# Patient Record
Sex: Male | Born: 1980 | Race: White | Hispanic: No | Marital: Single | Smoking: Former smoker
Health system: Southern US, Community
[De-identification: ages and names within clinical notes are randomized; demographics above are authoritative.]

## PROBLEM LIST (undated history)

## (undated) DIAGNOSIS — F429 Obsessive-compulsive disorder, unspecified: Secondary | ICD-10-CM

## (undated) DIAGNOSIS — Z9109 Other allergy status, other than to drugs and biological substances: Secondary | ICD-10-CM

## (undated) DIAGNOSIS — R7989 Other specified abnormal findings of blood chemistry: Secondary | ICD-10-CM

## (undated) HISTORY — DX: Other specified abnormal findings of blood chemistry: R79.89

## (undated) HISTORY — DX: Obsessive-compulsive disorder, unspecified: F42.9

## (undated) HISTORY — PX: NO PAST SURGERIES: SHX2092

---

## 2003-05-29 ENCOUNTER — Emergency Department (HOSPITAL_COMMUNITY): Admission: EM | Admit: 2003-05-29 | Discharge: 2003-05-30 | Payer: Self-pay | Admitting: *Deleted
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
## 2008-04-29 DIAGNOSIS — R7989 Other specified abnormal findings of blood chemistry: Secondary | ICD-10-CM

## 2008-04-29 HISTORY — DX: Other specified abnormal findings of blood chemistry: R79.89

## 2010-02-15 ENCOUNTER — Encounter: Admission: RE | Admit: 2010-02-15 | Discharge: 2010-03-20 | Payer: Self-pay | Admitting: Family Medicine

## 2010-11-12 ENCOUNTER — Ambulatory Visit (INDEPENDENT_AMBULATORY_CARE_PROVIDER_SITE_OTHER): Payer: 59 | Admitting: Professional

## 2010-11-12 DIAGNOSIS — F411 Generalized anxiety disorder: Secondary | ICD-10-CM

## 2010-11-22 ENCOUNTER — Ambulatory Visit (INDEPENDENT_AMBULATORY_CARE_PROVIDER_SITE_OTHER): Payer: 59 | Admitting: Professional

## 2010-11-22 DIAGNOSIS — F411 Generalized anxiety disorder: Secondary | ICD-10-CM

## 2011-06-09 ENCOUNTER — Emergency Department (HOSPITAL_COMMUNITY)
Admission: EM | Admit: 2011-06-09 | Discharge: 2011-06-09 | Disposition: A | Discharge status: Home / Self Care | Payer: BC Managed Care – PPO | Source: Home / Self Care | Attending: Family Medicine | Admitting: Family Medicine

## 2011-06-09 ENCOUNTER — Encounter (HOSPITAL_COMMUNITY): Payer: Self-pay

## 2011-06-09 DIAGNOSIS — J029 Acute pharyngitis, unspecified: Secondary | ICD-10-CM

## 2011-06-09 HISTORY — DX: Other allergy status, other than to drugs and biological substances: Z91.09

## 2011-06-09 MED ORDER — CLINDAMYCIN HCL 150 MG PO CAPS: 150.0000 mg | ORAL_CAPSULE | Freq: Four times a day (QID) | ORAL | Status: AC

## 2011-06-09 NOTE — ED Notes (Signed)
Sore throat started Monday, denies fever/n/v

## 2011-06-09 NOTE — ED Provider Notes (Signed)
History     CSN: [id number]  Arrival date & time 06/09/11  1448   None     Chief Complaint  Patient presents with  . Sore Throat    sore throat started on monday    (Consider location/radiation/quality/duration/timing/severity/associated sxs/prior treatment) Patient is a 31 y.o. male presenting with pharyngitis. The history is provided by the patient.  Sore Throat This is a new problem. The current episode started more than 2 days ago. The problem occurs constantly. The problem has been gradually worsening. Pertinent negatives include no chest pain and no abdominal pain. The symptoms are aggravated by swallowing.    Past Medical History  Diagnosis Date  . Environmental allergies     History reviewed. No pertinent past surgical history.  History reviewed. No pertinent family history.  History  Substance Use Topics  . Smoking status: Never Smoker   . Smokeless tobacco: Not on file  . Alcohol Use: Yes      Review of Systems  Constitutional: Negative.   HENT: Negative for congestion, rhinorrhea and postnasal drip.   Eyes: Negative.   Respiratory: Negative.   Cardiovascular: Negative for chest pain.  Gastrointestinal: Negative.  Negative for abdominal pain.  Genitourinary: Negative.     Allergies  Penicillins  Home Medications   Current Outpatient Rx  Name Route Sig Dispense Refill  . CETIRIZINE-PSEUDOEPHEDRINE ER 5-120 MG PO TB12 Oral Take 1 tablet by mouth 2 (two) times daily.    . TESTOSTERONE AQUEOUS IM Intramuscular Inject into the muscle.    Marland Kitchen CLINDAMYCIN HCL 150 MG PO CAPS Oral Take 1 capsule (150 mg total) by mouth every 6 (six) hours. 28 capsule 0    BP 132/87  Pulse 99  Temp(Src) 98.8 F (37.1 C) (Oral)  Resp 18  SpO2 100%  Physical Exam  Nursing note and vitals reviewed. Constitutional: He is oriented to person, place, and time. He appears well-developed and well-nourished.  HENT:  Head: Normocephalic.  Right Ear: External ear normal.    Left Ear: External ear normal.  Mouth/Throat: Oropharyngeal exudate and posterior oropharyngeal erythema present.  Eyes: Pupils are equal, round, and reactive to light.  Neck: Normal range of motion. Neck supple.  Lymphadenopathy:    He has cervical adenopathy.  Neurological: He is alert and oriented to person, place, and time.  Skin: Skin is warm and dry.  Psychiatric: He has a normal mood and affect.    ED Course  Procedures (including critical care time)   Labs Reviewed  POCT RAPID STREP A ([facility] CARE ONLY)   No results found.   1. Pharyngitis, acute       MDM  Strep neg        Barkley Bruns, MD 06/09/11 1606

## 2012-09-07 ENCOUNTER — Telehealth: Payer: Self-pay | Admitting: Family Medicine

## 2012-09-07 ENCOUNTER — Other Ambulatory Visit: Payer: Self-pay

## 2012-09-07 DIAGNOSIS — Z79899 Other long term (current) drug therapy: Secondary | ICD-10-CM

## 2012-09-07 DIAGNOSIS — R5383 Other fatigue: Secondary | ICD-10-CM

## 2012-09-07 DIAGNOSIS — Z125 Encounter for screening for malignant neoplasm of prostate: Secondary | ICD-10-CM

## 2012-09-07 DIAGNOSIS — E291 Testicular hypofunction: Secondary | ICD-10-CM

## 2012-09-07 DIAGNOSIS — E785 Hyperlipidemia, unspecified: Secondary | ICD-10-CM

## 2012-09-07 NOTE — Telephone Encounter (Signed)
Pt would like to make sure that a PSA, Blood serum Testosterone levels please as well as routine Labs

## 2012-09-07 NOTE — Telephone Encounter (Signed)
Blood work ordered in system. Papers upfront ready for pickup. Patient was notified.

## 2012-09-07 NOTE — Telephone Encounter (Signed)
Pt needs BW for PE appt on 5/21

## 2012-09-07 NOTE — Telephone Encounter (Signed)
CBC, liver, lipid profile, metabolic 7, testosterone, PSA. Reason: High risk medicine use for the PSA, CBC, liver.  Metabolic 7 reason-screening. Lipid profile reason hyperlipidemia.

## 2012-09-09 LAB — BASIC METABOLIC PANEL
BUN: 19 mg/dL (ref 6–23)
Calcium: 9.6 mg/dL (ref 8.4–10.5)
Creat: 1.12 mg/dL (ref 0.50–1.35)
Glucose, Bld: 81 mg/dL (ref 70–99)
Sodium: 140 mEq/L (ref 135–145)

## 2012-09-09 LAB — CBC WITH DIFFERENTIAL/PLATELET
Basophils Absolute: 0 10*3/uL (ref 0.0–0.1)
Eosinophils Absolute: 0.1 10*3/uL (ref 0.0–0.7)
Lymphs Abs: 0.9 10*3/uL (ref 0.7–4.0)
MCH: 29.2 pg (ref 26.0–34.0)
Neutrophils Relative %: 56 % (ref 43–77)
Platelets: 187 10*3/uL (ref 150–400)
RBC: 4.73 MIL/uL (ref 4.22–5.81)
WBC: 3.1 10*3/uL — ABNORMAL LOW (ref 4.0–10.5)

## 2012-09-09 LAB — LIPID PANEL
Cholesterol: 147 mg/dL (ref 0–200)
HDL: 37 mg/dL — ABNORMAL LOW (ref >39)
Total CHOL/HDL Ratio: 4 Ratio
Triglycerides: 78 mg/dL (ref <150)

## 2012-09-09 LAB — HEPATIC FUNCTION PANEL
Albumin: 4.7 g/dL (ref 3.5–5.2)
Alkaline Phosphatase: 56 U/L (ref 39–117)
Total Bilirubin: 0.7 mg/dL (ref 0.3–1.2)

## 2012-09-09 LAB — TESTOSTERONE: Testosterone: 318 ng/dL (ref 300–890)

## 2012-09-16 ENCOUNTER — Encounter: Payer: Self-pay | Admitting: Family Medicine

## 2012-09-16 ENCOUNTER — Ambulatory Visit (INDEPENDENT_AMBULATORY_CARE_PROVIDER_SITE_OTHER): Payer: BC Managed Care – PPO | Admitting: Family Medicine

## 2012-09-16 VITALS — BP 104/77 | HR 80 | Ht 72.5 in | Wt 192.6 lb

## 2012-09-16 DIAGNOSIS — R5383 Other fatigue: Secondary | ICD-10-CM

## 2012-09-16 DIAGNOSIS — Z7251 High risk heterosexual behavior: Secondary | ICD-10-CM

## 2012-09-16 DIAGNOSIS — Z0000 Encounter for general adult medical examination without abnormal findings: Secondary | ICD-10-CM

## 2012-09-16 NOTE — Progress Notes (Signed)
  Subjective:    Patient ID: Christopher Chase, male    DOB: [birth year], 32 y.o.   MRN: [id number]  HPI Dietary overall fairly good safety measures good patient denies any specific illnesses other than some lower back discomfort and occasional dizziness please see below he is in today for a wellness exam. Reviewed over her medications and reviewed over her lab work.  Patient relates sometimes when he is exercising he does feel dark minutes in the peripheral vision of the neck gets better he also states at times he pushes his heart rate up to 180 or 190 Review of Systems  Constitutional: Negative for fever, activity change and appetite change.  HENT: Negative for congestion, rhinorrhea and neck pain.   Eyes: Negative for discharge.  Respiratory: Negative for cough and wheezing.   Cardiovascular: Negative for chest pain.  Gastrointestinal: Negative for vomiting, abdominal pain and blood in stool.  Genitourinary: Negative for frequency and difficulty urinating.  Skin: Negative for rash.  Allergic/Immunologic: Negative for environmental allergies and food allergies.  Neurological: Negative for weakness and headaches.  Psychiatric/Behavioral: Negative for agitation.       Objective:   Physical Exam  Vitals reviewed. Constitutional: He appears well-developed and well-nourished.  HENT:  Head: Normocephalic and atraumatic.  Right Ear: External ear normal.  Left Ear: External ear normal.  Nose: Nose normal.  Mouth/Throat: Oropharynx is clear and moist.  Eyes: EOM are normal. Pupils are equal, round, and reactive to light.  Neck: Normal range of motion. Neck supple. No thyromegaly present.  Cardiovascular: Normal rate, regular rhythm and normal heart sounds.   No murmur heard. Pulmonary/Chest: Effort normal and breath sounds normal. No respiratory distress. He has no wheezes.  Abdominal: Soft. Bowel sounds are normal. He exhibits no distension and no mass. There is no tenderness.   Genitourinary: Penis normal.  Musculoskeletal: Normal range of motion. He exhibits no edema.  Lymphadenopathy:    He has no cervical adenopathy.  Neurological: He is alert. He exhibits normal muscle tone.  Skin: Skin is warm and dry. No erythema.  Psychiatric: He has a normal mood and affect. His behavior is normal. Judgment normal.          Assessment & Plan:  Wellness-In the usual measures were discussed with them regarding diet exercise. He does exercise on regular basis at times he pushes himself we'll order the one I think he should we talked about maximum heart rate and cardio exercise. Labs reviewed- labs were reviewed with him. No specific changes necessary today Back pain-Patient with some lower back pain on the right side he states that one leg is shorter than the other on examination he does have some slight scoliosis subjective discomfort in the right lower back the legs do not have a big discrepancy. I talked with him about referral to orthopedics how they may be able to help they may not be able to for now he will put this off. Dizziness-He relates dizziness related to when he gets up suddenly from lying down or sitting I checked his blood pressure laying and sitting and in there is minimal drop but there is some drop a talk with him about proper hydration and getting up slowly

## 2013-01-01 LAB — CBC WITH DIFFERENTIAL/PLATELET
Basophils Relative: 1 % (ref 0–1)
Eosinophils Absolute: 0.1 10*3/uL (ref 0.0–0.7)
Eosinophils Relative: 2 % (ref 0–5)
MCH: 29.4 pg (ref 26.0–34.0)
MCHC: 34.1 g/dL (ref 30.0–36.0)
Neutrophils Relative %: 61 % (ref 43–77)
Platelets: 194 10*3/uL (ref 150–400)
RDW: 12.7 % (ref 11.5–15.5)

## 2013-01-01 LAB — HIV ANTIBODY (ROUTINE TESTING W REFLEX): HIV: NONREACTIVE

## 2013-01-02 ENCOUNTER — Encounter: Payer: Self-pay | Admitting: Family Medicine

## 2013-01-21 ENCOUNTER — Telehealth: Payer: Self-pay | Admitting: Family Medicine

## 2013-01-21 NOTE — Telephone Encounter (Signed)
Need results of bloodwork.

## 2013-01-21 NOTE — Telephone Encounter (Signed)
Pt was seen for routine physical examination on 09-16-12.  He had to speak with his insurance regarding this date and was told that High Risk Sexual behavior was listed as a diagnosis.  He wants this removed from his chart as he states this does not pertain to him.  Please let him know this was removed out of his chart.

## 2013-01-21 NOTE — Telephone Encounter (Signed)
Results were given. Patient aware of it. For the business staff is working on removing the diagnosis.

## 2013-03-04 ENCOUNTER — Other Ambulatory Visit: Payer: Self-pay

## 2014-02-11 ENCOUNTER — Other Ambulatory Visit: Payer: Self-pay

## 2014-02-21 ENCOUNTER — Telehealth: Payer: Self-pay | Admitting: Family Medicine

## 2014-02-21 DIAGNOSIS — Z139 Encounter for screening, unspecified: Secondary | ICD-10-CM

## 2014-02-21 DIAGNOSIS — Z13220 Encounter for screening for lipoid disorders: Secondary | ICD-10-CM

## 2014-02-21 DIAGNOSIS — Z113 Encounter for screening for infections with a predominantly sexual mode of transmission: Secondary | ICD-10-CM

## 2014-02-21 NOTE — Telephone Encounter (Signed)
Pt would also like herpes I and II added to test. Consult with Dr. Lorin PicketScott ok to add. Pt also states it is ok to leave message on his voicemail with the results. [phone]. Orders ready. Pt notified.

## 2014-02-21 NOTE — Telephone Encounter (Signed)
Patient wanting to check for all STD's.

## 2014-02-21 NOTE — Telephone Encounter (Signed)
Last bloodwork 01/01/13. CBC, HIV

## 2014-02-21 NOTE — Telephone Encounter (Signed)
HIV antibody, VDRL, urine GC/Chlam ( first part of urine) also pt should do Lipid , glucose ( been over 1 year,he might defer) (also we rec a f/u ov)

## 2014-02-21 NOTE — Telephone Encounter (Signed)
Patient needs order for BW. °

## 2014-02-22 LAB — LIPID PANEL
CHOL/HDL RATIO: 2.8 ratio
CHOLESTEROL: 136 mg/dL (ref 0–200)
HDL: 48 mg/dL (ref >39)
LDL Cholesterol: 69 mg/dL (ref 0–99)
TRIGLYCERIDES: 96 mg/dL (ref <150)
VLDL: 19 mg/dL (ref 0–40)

## 2014-02-22 LAB — GLUCOSE, RANDOM: Glucose, Bld: 93 mg/dL (ref 70–99)

## 2014-02-22 LAB — HIV ANTIBODY (ROUTINE TESTING W REFLEX): HIV 1&2 Ab, 4th Generation: NONREACTIVE

## 2014-02-23 LAB — HSV(HERPES SIMPLEX VRS) I + II AB-IGG
HSV 1 Glycoprotein G Ab, IgG: <0.1 IV
HSV 2 Glycoprotein G Ab, IgG: <0.1 IV

## 2014-02-23 LAB — GC/CHLAMYDIA PROBE AMP, URINE
CHLAMYDIA, SWAB/URINE, PCR: NEGATIVE
GC PROBE AMP, URINE: NEGATIVE

## 2014-02-23 LAB — VDRL: VDRL, SERUM: NONREACTIVE

## 2014-03-07 ENCOUNTER — Ambulatory Visit: Payer: BC Managed Care – PPO | Admitting: Family Medicine

## 2014-05-12 ENCOUNTER — Ambulatory Visit (INDEPENDENT_AMBULATORY_CARE_PROVIDER_SITE_OTHER): Payer: BLUE CROSS/BLUE SHIELD | Admitting: Psychiatry

## 2014-05-12 ENCOUNTER — Encounter (HOSPITAL_COMMUNITY): Payer: Self-pay | Admitting: Psychiatry

## 2014-05-12 VITALS — BP 129/87 | HR 74 | Wt 200.0 lb

## 2014-05-12 DIAGNOSIS — F429 Obsessive-compulsive disorder, unspecified: Secondary | ICD-10-CM

## 2014-05-12 DIAGNOSIS — F42 Obsessive-compulsive disorder: Secondary | ICD-10-CM

## 2014-05-12 MED ORDER — ESCITALOPRAM OXALATE 10 MG PO TABS: 10.0000 mg | ORAL_TABLET | Freq: Every day | ORAL | Status: DC

## 2014-05-12 MED ORDER — CLONAZEPAM 0.5 MG PO TABS: ORAL_TABLET | ORAL | Status: DC
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
## 2014-05-17 ENCOUNTER — Ambulatory Visit (INDEPENDENT_AMBULATORY_CARE_PROVIDER_SITE_OTHER): Payer: BLUE CROSS/BLUE SHIELD | Admitting: Nurse Practitioner

## 2014-05-17 ENCOUNTER — Encounter: Payer: Self-pay | Admitting: Nurse Practitioner

## 2014-05-17 VITALS — BP 112/74 | Ht 72.5 in | Wt 204.0 lb

## 2014-05-17 DIAGNOSIS — R7989 Other specified abnormal findings of blood chemistry: Secondary | ICD-10-CM

## 2014-05-17 DIAGNOSIS — E291 Testicular hypofunction: Secondary | ICD-10-CM

## 2014-05-17 DIAGNOSIS — N529 Male erectile dysfunction, unspecified: Secondary | ICD-10-CM

## 2014-05-17 MED ORDER — TADALAFIL 10 MG PO TABS: 10.0000 mg | ORAL_TABLET | Freq: Every day | ORAL | Status: DC | PRN

## 2014-05-18 ENCOUNTER — Encounter: Payer: Self-pay | Admitting: Nurse Practitioner

## 2014-05-18 NOTE — Progress Notes (Signed)
Subjective:  Presents for c/o ED. History of low testosterone; has been off therapy for over one year. Has an appt with Dr. [name], his endocrinologist to restart therapy. Was place on Lexapro and Klonopin by behavioral health for anxiety and OCD. Also has plans to get counseling. Requesting Cialis to help with ED in the meantime. No history of cardiac problems.   Objective:   BP 112/74 mmHg  Ht 6' 0.5" (1.842 m)  Wt 204 lb (92.534 kg)  BMI 27.27 kg/m2 NAD. Alert, oriented. Lungs clear. Heart RRR.  Assessment: Low testosterone  Erectile dysfunction, unspecified erectile dysfunction type  Plan:  Meds ordered this encounter  Medications  . tadalafil (CIALIS) 10 MG tablet    Sig: Take 1 tablet (10 mg total) by mouth daily as needed for erectile dysfunction.    Dispense:  10 tablet    Refill:  2    Order Specific Question:  Supervising Provider    Answer:  Merlyn AlbertLUKING, WILLIAM S [[id number]   Trial of Cialis. Follow up with Dr. [name] as planned. Also discussed potential sexual side effects of Lexapro.

## 2014-05-27 ENCOUNTER — Ambulatory Visit (INDEPENDENT_AMBULATORY_CARE_PROVIDER_SITE_OTHER): Payer: BLUE CROSS/BLUE SHIELD | Admitting: Clinical

## 2014-05-27 ENCOUNTER — Encounter (HOSPITAL_COMMUNITY): Payer: Self-pay | Admitting: Clinical

## 2014-05-27 DIAGNOSIS — F429 Obsessive-compulsive disorder, unspecified: Secondary | ICD-10-CM

## 2014-05-27 DIAGNOSIS — F42 Obsessive-compulsive disorder: Secondary | ICD-10-CM | POA: Diagnosis not present
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
## 2014-06-02 ENCOUNTER — Ambulatory Visit (HOSPITAL_COMMUNITY): Payer: Self-pay | Admitting: Psychiatry

## 2014-06-03 ENCOUNTER — Encounter (HOSPITAL_COMMUNITY): Payer: Self-pay | Admitting: Psychiatry

## 2014-06-03 ENCOUNTER — Ambulatory Visit (INDEPENDENT_AMBULATORY_CARE_PROVIDER_SITE_OTHER): Payer: BLUE CROSS/BLUE SHIELD | Admitting: Psychiatry

## 2014-06-03 VITALS — BP 136/80 | HR 89 | Ht 72.0 in | Wt 202.0 lb

## 2014-06-03 DIAGNOSIS — F42 Obsessive-compulsive disorder: Secondary | ICD-10-CM

## 2014-06-03 DIAGNOSIS — F429 Obsessive-compulsive disorder, unspecified: Secondary | ICD-10-CM

## 2014-06-03 MED ORDER — CLONAZEPAM 0.5 MG PO TABS: ORAL_TABLET | ORAL | Status: DC

## 2014-06-03 MED ORDER — ESCITALOPRAM OXALATE 10 MG PO TABS: 10.0000 mg | ORAL_TABLET | Freq: Every day | ORAL | Status: DC

## 2014-06-03 NOTE — Progress Notes (Signed)
[facility] [id number] Progress Note  Christopher Chase [id number] 34 y.o.  06/03/2014 12:34 PM  Chief Complaint:  I like my medication.  I'm feeling better.    History of Present Illness:  Christopher Chase came for his follow-up appointment.  He was seen first time 3 weeks ago as initial evaluation.  He was started on Lexapro and low-dose Klonopin.  He is taking Lexapro 10 mg daily.  He is feeling less anxious but sometime he is feeling very tired in the morning.  He endorsed since taking medication there was only one episode when he felt very nervous anxious but he did not lost his temper which he believes because of medication.  He continues to have obsessive thoughts but overall he feels the relationship with the girlfriend is doing very well.  He started seeing [name] and he like to continue therapy with her.  He is concerned about sexual side effects from Lexapro and he has noticed an past 2 weeks decreased desire and libido.  However he also supposed to start testosterone which she has not started yet.  Patient has history of low testosterone and recently he was given a prescription by Dr. [name] which he has not started yet.  Patient is taking Klonopin half tablet only as needed.  He has cut down his drinking a lot and he denies any binge drinking.  His appetite is okay.  Patient denies any paranoia, hallucination, crying spells or any feeling of hopelessness or worthlessness.  He wants to continue Lexapro 10 mg for now however if sexual side effect us persist that he will consider switching to different medication.  He also endorsed some time low appetite but his weight and vitals are normal.    Suicidal Ideation: No Plan Formed: No Patient has means to carry out plan: No  Homicidal Ideation: No Plan Formed: No Patient has means to carry out plan: No  Past Psychiatric History/Hospitalization(s) Patient endorse history of anxiety and OCD symptoms while he was in college.  At that time he has  taken antidepressant but does not remember the dose and name.  He mentioned it helped his anxiety symptoms but he stopped after some time and he was feeling better.  He remembered taking Xanax and Zoloft which helped him but he was feeling zombie after taking Zoloft for a long time.  Patient also endorsed history of using drugs and alcohol in the college but denies any withdrawal symptoms.  Patient denies any history of mania, psychosis, hallucination, suicidal attempt or any inpatient psychiatric treatment. Anxiety: Yes Bipolar Disorder: No Depression: No Mania: No Psychosis: No Schizophrenia: No Personality Disorder: No Hospitalization for psychiatric illness: No History of Electroconvulsive Shock Therapy: No Prior Suicide Attempts: No  Medical History; Patient denies any history of seizures.  His primary care physician is Dr. [name] luking in [place].  He has seasonal allergies and low testosterone.  Review of Systems  Constitutional: Positive for malaise/fatigue.  Musculoskeletal: Negative.   Skin: Negative.   Neurological: Negative.   Psychiatric/Behavioral: The patient is nervous/anxious.     Psychiatric: Agitation: No Hallucination: No Depressed Mood: No Insomnia: No Hypersomnia: No Altered Concentration: No Feels Worthless: No Grandiose Ideas: No Belief In Special Powers: No New/Increased Substance Abuse: No Compulsions: Yes  Neurologic: Headache: No Seizure: No Paresthesias: No   Musculoskeletal: Strength & Muscle Tone: within normal limits Gait & Station: normal Patient leans: N/A   Outpatient Encounter Prescriptions as of 06/03/2014  Medication Sig  . cetirizine-pseudoephedrine (ZYRTEC-D) 5-120 MG  per tablet Take 1 tablet by mouth 2 (two) times daily.  . clonazePAM (KLONOPIN) 0.5 MG tablet Take 1/2 tab as needed  . escitalopram (LEXAPRO) 10 MG tablet Take 1 tablet (10 mg total) by mouth daily.  . tadalafil (CIALIS) 10 MG tablet Take 1 tablet (10 mg  total) by mouth daily as needed for erectile dysfunction.  . [DISCONTINUED] clonazePAM (KLONOPIN) 0.5 MG tablet Take 1/2 tab as needed  . [DISCONTINUED] escitalopram (LEXAPRO) 10 MG tablet Take 1 tablet (10 mg total) by mouth daily.  . fluticasone (FLONASE) 50 MCG/ACT nasal spray Place 2 sprays into the nose daily.    No results found for this or any previous visit (from the past 2160 hour(s)).    Constitutional:  BP 136/80 mmHg  Pulse 89  Ht 6' (1.829 m)  Wt 202 lb (91.627 kg)  BMI 27.39 kg/m2   Mental Status Examination;  Patient is well groomed well dressed man who is pleasant and cooperative.  His his speech is clear and coherent.  He described his mood anxious and his affect is mood appropriate.  His attention and concentration is fair.  He denies any auditory or visual hallucination.  He denies any active or passive suicidal thoughts or homicidal thought.  There were no delusions, paranoia or any obsessive thoughts.  His psychomotor activity is slightly increased.  His fund of knowledge is adequate.  He has no flight of ideas or any loose association.  He is alert and oriented 3.  His insight judgment and impulse control is okay.   Established Problem, Stable/Improving (1), Review of Psycho-Social Stressors (1), Decision to obtain old records (1), Review and summation of old records (2), Review of Last Therapy Session (1) and Review of Medication Regimen & Side Effects (2)  Assessment: Axis I: Obsessive-compulsive disorder.  Rule out alcohol abuse  Axis II: Deferred  Axis III:  Past Medical History  Diagnosis Date  . Environmental allergies   . Decreased testosterone level 2010    Reported then learned he has low testosterone levels     Plan:  Patient is taking Lexapro 10 mg daily.  He does not have any side effects other than he feels some time very tired and he has noticed decreased libido.  However he is scheduled to start testosterone very soon.  He is taking  Klonopin half tablet 0.5 mg as needed which is helping his anxiety.  Though he continues to have some obsessive thoughts for the less intense and less frequent.  I encouraged him to see Scarlette Calico for coping and social skills and CBT techniques.  We will defer any medication adjustment at this time however if patient continued to express sexual side effects we will consider switching the medication.  Discussed medication side effects and benefits.  Recommended to call us back if he has any further questions or if he feel worsening of the symptom.  I will see him again in 4 weeks. Time spent 25 minutes.  More than 50% of the time spent in psychoeducation, counseling and coordination of care.  Discuss safety plan that anytime having active suicidal thoughts or homicidal thoughts then patient need to call 911 or go to the local emergency room.    Theona Muhs T., MD 06/03/2014

## 2014-06-09 ENCOUNTER — Ambulatory Visit (INDEPENDENT_AMBULATORY_CARE_PROVIDER_SITE_OTHER): Payer: BLUE CROSS/BLUE SHIELD | Admitting: Clinical

## 2014-06-09 ENCOUNTER — Encounter (HOSPITAL_COMMUNITY): Payer: Self-pay | Admitting: Clinical

## 2014-06-09 DIAGNOSIS — F42 Obsessive-compulsive disorder: Secondary | ICD-10-CM

## 2014-06-09 DIAGNOSIS — F429 Obsessive-compulsive disorder, unspecified: Secondary | ICD-10-CM

## 2014-06-09 NOTE — Progress Notes (Signed)
   THERAPIST PROGRESS NOTE  Session Time: 11:00 -12:00  Participation Level: Active  Behavioral Response: Casual and NeatAlertNA  Type of Therapy: Individual Therapy  Treatment Goals addressed: improve psychiatric symptoms,  emotional regulations skills Interventions: CBT and Motivational Interviewing  Summary: Christopher Chase is a 34 y.o. male who presents with OCD.   Suicidal/Homicidal: Nowithout intent/plan  Therapist Response: Hassell Done met with clinician for an individual session. [name] discussed his psychiatric symptoms and his current life events. [name] shared that he continues to be insecure in his significant relationship. He shared that the relationship has been rocky as of late and he is unsure whether to remain in it or to leave. He reports that his current anxiety is focused on the relationship. He shared that he believes he would be fine without the relationship if it were over but that while in the relationship he obsesses about whether or not the relationship is okay. [name] explain the ways this manifest as well as his girlfriends reaction to him. [name] shared how his actions were interpreted by past partners. Hassell Done and clinician discussed some of his core beliefs. One he identified was that  "I am at fault." Client and clinician discussed how this belief affects the way he argues as well as how he acts towards the other person. Client and clinician began a discussion about how one goes about about changing ones beliefs.  Plan: Return again in 1 week.  Diagnosis: Axis I: OCD     Javed Cotto A, LCSW 06/09/2014

## 2014-06-16 ENCOUNTER — Ambulatory Visit (HOSPITAL_COMMUNITY): Payer: Self-pay | Admitting: Clinical

## 2014-06-23 ENCOUNTER — Ambulatory Visit (HOSPITAL_COMMUNITY): Payer: Self-pay | Admitting: Clinical

## 2014-06-30 ENCOUNTER — Ambulatory Visit (INDEPENDENT_AMBULATORY_CARE_PROVIDER_SITE_OTHER): Payer: BLUE CROSS/BLUE SHIELD | Admitting: Clinical

## 2014-06-30 ENCOUNTER — Encounter (HOSPITAL_COMMUNITY): Payer: Self-pay | Admitting: Clinical

## 2014-06-30 DIAGNOSIS — F42 Obsessive-compulsive disorder: Secondary | ICD-10-CM

## 2014-06-30 DIAGNOSIS — F429 Obsessive-compulsive disorder, unspecified: Secondary | ICD-10-CM

## 2014-06-30 NOTE — Progress Notes (Signed)
   THERAPIST PROGRESS NOTE  Session Time: 11:08-12:05  Participation Level: Active  Behavioral Response: Casual and NeatAlertNA  Type of Therapy: Individual Therapy  Treatment Goals addressed: improve psychiatric symptoms, interpersonal relationship skills, emotional regulations skills Interventions: CBT and Motivational Interviewing  Summary: Christopher Chase is a 34 y.o. male who presents with OCD.   Suicidal/Homicidal: Nowithout intent/plan  Therapist Response:  [name] met with clinician for an individual session. [name] discussed his psychiatric symptoms, his current life events, and his homework. [name] shared that he had to delay coming to his earlier scheduled appointments due to problems with his insurance. [name] shared that he had been using the skills taught in therapy and that they had been helping him. [name] shared that he was having some trouble with his thoughts around his relationship. Clinician introduced a 7 panel cbt thought record sheet and client and clinician completed it together. [name] identified his negative automatic thoughts, his emotions, the evidence for and against the thought and healthier alternative thoughts. [name] shared his thoughts and insight about the process. [name] agreed to complete some cbt and grounding homework before next session. [name] and clinician discussed how he could apply his insights to improve his relationships.   Plan: Return again in 1-2 weeks.  Diagnosis: Axis I: Obsessive compulsive disorder      Mignonne Afonso A, LCSW 06/30/2014

## 2014-07-04 ENCOUNTER — Encounter (HOSPITAL_COMMUNITY): Payer: Self-pay | Admitting: Psychiatry

## 2014-07-04 ENCOUNTER — Ambulatory Visit (INDEPENDENT_AMBULATORY_CARE_PROVIDER_SITE_OTHER): Payer: BLUE CROSS/BLUE SHIELD | Admitting: Psychiatry

## 2014-07-04 VITALS — BP 121/79 | HR 69 | Ht 72.0 in | Wt 208.0 lb

## 2014-07-04 DIAGNOSIS — F429 Obsessive-compulsive disorder, unspecified: Secondary | ICD-10-CM

## 2014-07-04 DIAGNOSIS — F42 Obsessive-compulsive disorder: Secondary | ICD-10-CM

## 2014-07-04 MED ORDER — LORAZEPAM 0.5 MG PO TABS: 0.5000 mg | ORAL_TABLET | ORAL | Status: AC | PRN

## 2014-07-04 MED ORDER — ESCITALOPRAM OXALATE 10 MG PO TABS: 10.0000 mg | ORAL_TABLET | Freq: Every day | ORAL | Status: DC

## 2014-07-04 NOTE — Progress Notes (Signed)
[facility] [id number] Progress Note  Christopher AhoJoseph E Chase [id number] 34 y.o.  07/04/2014 3:46 PM  Chief Complaint:  I stop Klonopin because I felt it was causing sexual side effects.      History of Present Illness:  Christopher LongsJoseph came for his follow-up appointment.  He stopped taking Klonopin because he felt it was causing sexual side effects.  Overall his anxiety is much better.  He has been tolerating his Lexapro without any major side effects.  He still feels sometimes tired in the morning but he is overall doing very well.  He denies any major panic attack.  He started seeing [name] for counseling.  He reported his OCD symptoms are less intense and less frequent.  He is prescribed testosterone by his endocrinologist but he is still waiting prescription to be filled.  Patient denies any hallucination, paranoia, crying spells or any feeling of hopelessness or worthlessness.  His relationship is going very well.  He wants to continue Lexapro but wondering if he can take some other medication to help his severe anxiety attacks if needed.  Patient denies drinking or using any illegal substances.  His appetite is okay.  His vitals are stable.  Suicidal Ideation: No Plan Formed: No Patient has means to carry out plan: No  Homicidal Ideation: No Plan Formed: No Patient has means to carry out plan: No  Past Psychiatric History/Hospitalization(s) Patient endorse history of anxiety and OCD symptoms while he was in college.  He took antidepressant which helped him but he does not remember the details.  He was given Xanax and Zoloft by his primary care physician which also helped him and he felt zombie. Patient also endorsed history of using drugs and alcohol in the college but denies any withdrawal symptoms.  Patient denies any history of mania, psychosis, hallucination, suicidal attempt or any inpatient psychiatric treatment. Anxiety: Yes Bipolar Disorder: No Depression: No Mania: No Psychosis:  No Schizophrenia: No Personality Disorder: No Hospitalization for psychiatric illness: No History of Electroconvulsive Shock Therapy: No Prior Suicide Attempts: No  Medical History; Patient denies any history of seizures.  His primary care physician is Dr. [name] luking in [place].  He has seasonal allergies and low testosterone.  ROS  Psychiatric: Agitation: No Hallucination: No Depressed Mood: No Insomnia: No Hypersomnia: No Altered Concentration: No Feels Worthless: No Grandiose Ideas: No Belief In Special Powers: No New/Increased Substance Abuse: No Compulsions: No  Neurologic: Headache: No Seizure: No Paresthesias: No   Musculoskeletal: Strength & Muscle Tone: within normal limits Gait & Station: normal Patient leans: N/A   Outpatient Encounter Prescriptions as of 07/04/2014  Medication Sig  . cetirizine-pseudoephedrine (ZYRTEC-D) 5-120 MG per tablet Take 1 tablet by mouth 2 (two) times daily.  Marland Kitchen. escitalopram (LEXAPRO) 10 MG tablet Take 1 tablet (10 mg total) by mouth daily.  . fluticasone (FLONASE) 50 MCG/ACT nasal spray Place 2 sprays into the nose daily.  . tadalafil (CIALIS) 10 MG tablet Take 1 tablet (10 mg total) by mouth daily as needed for erectile dysfunction.  . [DISCONTINUED] clonazePAM (KLONOPIN) 0.5 MG tablet Take 1/2 tab as needed  . [DISCONTINUED] escitalopram (LEXAPRO) 10 MG tablet Take 1 tablet (10 mg total) by mouth daily.  Marland Kitchen. LORazepam (ATIVAN) 0.5 MG tablet Take 1 tablet (0.5 mg total) by mouth as needed for anxiety.    No results found for this or any previous visit (from the past 2160 hour(s)).    Constitutional:  BP 121/79 mmHg  Pulse 69  Ht 6' (1.829  m)  Wt 208 lb (94.348 kg)  BMI 28.20 kg/m2   Mental Status Examination;  Patient is well groomed well dressed man who is pleasant and cooperative.  His his speech is clear and coherent.  He described his mood anxious and his affect is mood appropriate.  His attention and  concentration is fair.  He denies any auditory or visual hallucination.  He denies any active or passive suicidal thoughts or homicidal thought.  There were no delusions, paranoia or any obsessive thoughts.  His psychomotor activity is slightly increased.  His fund of knowledge is adequate.  He has no flight of ideas or any loose association.  He is alert and oriented 3.  His insight judgment and impulse control is okay.   Established Problem, Stable/Improving (1), Review of Last Therapy Session (1), Review of Medication Regimen & Side Effects (2) and Review of New Medication or Change in Dosage (2)  Assessment: Axis I: Obsessive-compulsive disorder.  Rule out alcohol abuse  Axis II: Deferred  Axis III:  Past Medical History  Diagnosis Date  . Environmental allergies   . Decreased testosterone level 2010    Reported then learned he has low testosterone levels  . Obsessive-compulsive disorder      Plan:  Patient is doing better on Lexapro 10 mg.  I will continue Klonopin since patient reported sexual side effects with Klonopin.  We will try Ativan 0.5 mg as needed .  Discussed medication side effects and benefits.  Follow-up in 3 months .  Encouraged to keep appointment with Scarlette Calico for coping and social skills.  Jasmaine Rochel T., MD 07/04/2014

## 2014-07-07 ENCOUNTER — Ambulatory Visit (HOSPITAL_COMMUNITY): Payer: Self-pay | Admitting: Clinical

## 2014-07-14 ENCOUNTER — Ambulatory Visit (INDEPENDENT_AMBULATORY_CARE_PROVIDER_SITE_OTHER): Payer: BLUE CROSS/BLUE SHIELD | Admitting: Clinical

## 2014-07-14 ENCOUNTER — Encounter (HOSPITAL_COMMUNITY): Payer: Self-pay | Admitting: Clinical

## 2014-07-14 DIAGNOSIS — F42 Obsessive-compulsive disorder: Secondary | ICD-10-CM | POA: Diagnosis not present

## 2014-07-14 DIAGNOSIS — F429 Obsessive-compulsive disorder, unspecified: Secondary | ICD-10-CM

## 2014-07-14 NOTE — Progress Notes (Signed)
THERAPIST PROGRESS NOTE  Session Time: 11:00 -12:00  Participation Level: Active  Behavioral Response: Casual and NeatAlertNA  Type of Therapy: Individual Therapy  Treatment Goals addressed: improve psychiatric symptoms, calming skills, interpersonal relationship skills, emotional regulations skills  Interventions: CBT, Motivational Interviewing and Other: Grounding and mindfulness techniques  Summary: Christopher Chase is a 33 y.o. male who presents with Obsessive compulsive disorder .   Suicidal/Homicidal: Nowithout intent/plan  Therapist Response:  Christopher Chase met with clinician for an individual session. Christopher Chase discussed his psychiatric symptoms, his current life events, and his homework. Christopher Chase shared that he had been practicing the skills taught in therapy. He shared that this has helped his anxiety some. He shared about his current relationship. He shared that he was practicing healthy boundaries. He shared that the more he practices the easier it has become. He shared that he is able to use his grounding techniques to interrupt negative thought cycles more often and to calm himself down. Christopher Chase shared that one of the things that causes him lots of anxiety is his people pleasing tendencies. He shared about his internal conflict about wanting to make his partner happy (she would like to have the relationship move more quickly)  and his  Christopher Chase to honor what is true for him. Client and clinician discussed the long term and short term results of going along, and being honest. He shared how his obsessive thoughts about needing to know everything is okay in the relationship showed up. He shared how he was practicing his cbt to address some of his automatic thoughts. Client and clinician discussed his progress and areas for improvement. Christopher Chase also discussed his children and his insight about how he could continue to improve his relationship with them. Christopher Chase agreed to continue his homework until  next session Plan: Return again in 1 -2 weeks.  Diagnosis: Axis I: Obsessive compulsive disorder       Powell,Frances A, LCSW 07/14/2014  

## 2014-07-21 ENCOUNTER — Encounter (HOSPITAL_COMMUNITY): Payer: Self-pay | Admitting: Clinical

## 2014-07-21 ENCOUNTER — Ambulatory Visit (INDEPENDENT_AMBULATORY_CARE_PROVIDER_SITE_OTHER): Payer: BLUE CROSS/BLUE SHIELD | Admitting: Clinical

## 2014-07-21 DIAGNOSIS — F42 Obsessive-compulsive disorder: Secondary | ICD-10-CM

## 2014-07-21 DIAGNOSIS — F429 Obsessive-compulsive disorder, unspecified: Secondary | ICD-10-CM

## 2014-07-21 NOTE — Progress Notes (Signed)
   THERAPIST PROGRESS NOTE  Session Time: 11:08 - 12:03  Participation Level: Active  Behavioral Response: NeatAlertpositive    Type of Therapy: Individual Therapy  Treatment Goals addressed: improve psychiatric symptoms, calming skills, interpersonal relationship skills, emotional regulations skills  Interventions: CBT, Motivational Interviewing and Other: Grounding and mindfulness techniques  Summary: Christopher Chase is a 34 y.o. male who presents with Obsessive compulsive disorder .   Suicidal/Homicidal: No - without intent/plan  Therapist Response:  Christopher Chase met with clinician for an individual session. Christopher Chase discussed his psychiatric symptoms, his current life events, and his homework. Christopher Chase shared that he and his girlfriend broke up. He shared about the event. Christopher Chase Kitchen He shared that he had used some of the techniques learned in therapy to help him to remain more calm and rational. Christopher Chase and clinician discussed some of his thoughts and insights about himself in and ending relationship. He shared the things she said that he would like to explore and correct if needed. Christopher Chase and clinician discussed his thoughts about what he handled well and what he could have handled better. Christopher Chase and clinician discussed his homework. [name] shared that he got a little off track due to the relationship issues. He shared that he did use his grounding techniques and his cbt skills which helped with his emotional regulation.Client and clinician discussed the skills he used. He agreed to get back on track with the homework and will work on it until next session. Plan: Return again in 1 -2 weeks.  Diagnosis: Axis I: Obsessive compulsive disorder    Savahna Casados A, LCSW 07/21/2014

## 2014-07-28 ENCOUNTER — Ambulatory Visit (INDEPENDENT_AMBULATORY_CARE_PROVIDER_SITE_OTHER): Payer: BLUE CROSS/BLUE SHIELD | Admitting: Clinical

## 2014-07-28 ENCOUNTER — Encounter (HOSPITAL_COMMUNITY): Payer: Self-pay | Admitting: Clinical

## 2014-07-28 DIAGNOSIS — F42 Obsessive-compulsive disorder: Secondary | ICD-10-CM | POA: Diagnosis not present

## 2014-07-28 DIAGNOSIS — F429 Obsessive-compulsive disorder, unspecified: Secondary | ICD-10-CM

## 2014-07-28 NOTE — Progress Notes (Signed)
   THERAPIST PROGRESS NOTE  Session Time: 11:05 -11:58  Participation Level: Active  Behavioral Response: CasualAlertNA  Type of Therapy: Individual Therapy  Treatment Goals addressed: improve psychiatric symptoms,  interpersonal relationship skills, emotional regulations skills  Interventions: CBT, Motivational Interviewing and Other: Grounding and mindfulness techniques  Summary: Christopher Chase is a 34 y.o. male who presents with Obsessive compulsive disorder .   Suicidal/Homicidal: No - without intent/plan  Therapist Response:  Christopher Chase met with clinician for an individual session. Christopher Chase discussed his psychiatric symptoms and his current life events. Christopher Chase shared that he continues to be separated from his girlfriend. He shared that he thinks this is best. He also shared about some of his old core beliefs about being alone. He shared that he recognized them from his past and was wanting to challenge them. He shared that he feels challenging help him to healthier in future relationships. Client and clinician discussed the beliefs. Client and clinician reviewed the 7 panel thought record sheet. Client and clinician discussed how he could use the sheet to challenge the core beliefs. Client and clinician discussed some of his behaviors that result from the beliefs. Client and clinician agreed to discuss this further at a future session. Christopher Chase discussed some of his friendships and some of his negative thoughts about himself. Client and clinician discussed how his negative thoughts about himself might be affecting his relationships. Christopher Chase agreed to consider the topic. Christopher Chase agreed to complete some cbt homework before next session.  Plan: Return again in 1 -2 weeks.  Diagnosis: Axis I: Obsessive compulsive disorder   Kimora Stankovic A, LCSW 07/28/2014

## 2014-08-04 ENCOUNTER — Encounter (HOSPITAL_COMMUNITY): Payer: Self-pay | Admitting: Clinical

## 2014-08-04 ENCOUNTER — Ambulatory Visit (INDEPENDENT_AMBULATORY_CARE_PROVIDER_SITE_OTHER): Payer: BLUE CROSS/BLUE SHIELD | Admitting: Clinical

## 2014-08-04 DIAGNOSIS — F42 Obsessive-compulsive disorder: Secondary | ICD-10-CM

## 2014-08-04 DIAGNOSIS — F429 Obsessive-compulsive disorder, unspecified: Secondary | ICD-10-CM

## 2014-08-04 NOTE — Progress Notes (Signed)
   THERAPIST PROGRESS NOTE  Session Time: 11:00 -12:00  Participation Level: Active  Behavioral Response: Casual and NeatAlertNA  Type of Therapy: Individual Therapy  Treatment Goals addressed: improve psychiatric symptoms,  interpersonal relationship skills, emotional regulations skills  Interventions: CBT, Motivational Interviewing and Other: Grounding and mindfulness techniques  Summary: Christopher Chase is a 34 y.o. male who presents with Obsessive compulsive disorder .   Suicidal/Homicidal: No - without intent/plan  Therapist Response:  [name] met with clinician for an individual session. [name] discussed his psychiatric symptoms, his current life events, and his homework. [name] shared that he had reconciled his relationship with his girlfriend. He shared that he recognizes that he still needs to address his personal growth regardless if they are together or not. [name] shared about his anxieties.  [name] shared the skills he used to help him to listen and present what was true for him. Client and clinician discussed what worked and where he could adjust. [name] discussed his insights about some of his OCD symptoms and relationships. Client and clinician discussed his cbt homework. [name] shared his thoughts and insights from the homework. He shared his insights about some of  his thought patterns. Client and clinician discussed his patterns. [name] agreed to continue his homework until next session.   Plan: Return again in 1 -2 weeks.  Diagnosis: Axis I: Obsessive compulsive disorder    Cadince Hilscher A, LCSW 08/04/2014  Eo [id number]

## 2014-08-11 ENCOUNTER — Ambulatory Visit (INDEPENDENT_AMBULATORY_CARE_PROVIDER_SITE_OTHER): Payer: BLUE CROSS/BLUE SHIELD | Admitting: Clinical

## 2014-08-11 DIAGNOSIS — F42 Obsessive-compulsive disorder: Secondary | ICD-10-CM | POA: Diagnosis not present

## 2014-08-11 DIAGNOSIS — F429 Obsessive-compulsive disorder, unspecified: Secondary | ICD-10-CM

## 2014-08-11 NOTE — Progress Notes (Signed)
   THERAPIST PROGRESS NOTE  Session Time: 11:18 - 12:02  Participation Level: Active  Behavioral Response: NeatAlertNA  Type of Therapy: Individual Therapy  Treatment Goals addressed: improve psychiatric symptoms, interpersonal relationship skills, emotional regulations skills  Interventions: CBT, Motivational Interviewing  Summary: Christopher Chase is a 34 y.o. male who presents with Obsessive compulsive disorder .   Suicidal/Homicidal: No - without intent/plan  Therapist Response:  [name] met with clinician for an individual session. Mostafa discussed his psychiatric symptoms, his current life events, and his homework. Jayen shared that it didn't work out with his girlfriend and he believes they are completely done now. Gared shared his thoughts and insights about the relationship. He reflected on how his symptoms affected his thoughts and perceptions. He discussed his desire to improve his thoughts for future relationships. He shared that he recognized the ways in which he had put more (than she did) into the relationship in order to keep his anxiety symptoms down. Client and clinician discussed how this worked and what the long term cost and benefits were.Client and clinician discussed the differences between passive, assertive and aggressive. Client and clinician agreed to discuss this further in future sessions.  [name] and clinician reviewed and discussed his homework. Yotam agreed to continue his homework until next session  Plan: Return again in 1 -2 weeks.  Diagnosis: Axis I: Obsessive compulsive disorder    Halli Equihua A, LCSW 08/11/2014

## 2014-08-13 ENCOUNTER — Encounter (HOSPITAL_COMMUNITY): Payer: Self-pay | Admitting: Clinical

## 2014-08-18 ENCOUNTER — Ambulatory Visit (INDEPENDENT_AMBULATORY_CARE_PROVIDER_SITE_OTHER): Payer: BLUE CROSS/BLUE SHIELD | Admitting: Clinical

## 2014-08-18 DIAGNOSIS — F42 Obsessive-compulsive disorder: Secondary | ICD-10-CM | POA: Diagnosis not present

## 2014-08-18 DIAGNOSIS — F429 Obsessive-compulsive disorder, unspecified: Secondary | ICD-10-CM

## 2014-08-18 NOTE — Progress Notes (Signed)
   THERAPIST PROGRESS NOTE  Session Time: 11:04-12:00  Participation Level: Active  Behavioral Response: CasualAlertNA  Type of Therapy: Individual Therapy  Treatment Goals addressed: improve psychiatric symptoms, interpersonal relationship skills, emotional regulations skills  Interventions: CBT, Motivational Interviewing and   Summary: Christopher Chase is a 34 y.o. male who presents with Obsessive compulsive disorder .   Suicidal/Homicidal: No - without intent/plan  Therapist Response:  [name] met with clinician for an individual session. [name] discussed his psychiatric symptoms, his current life events, and his homework. [name] shared that he has been working a lot and that he plans to continue to be seperated from his ex-girlfriend. [name] shared that he had completed his homework. Client and clinician reviewed his homework and discussed [name]'s thought and insights about his homework. [name] discussed some of his past relationships. He discussed how his symptoms affected his behaviors. He shared about holding his thoughts and emotions until they came oout in inappropriate ways. Client and clinician discussed this cycle and ways that [name] could improve his relationships by addressing things when they occur - either with the other person when appropriate or within himself when the issue was his symptoms. [name] agreed to continue his homework until next session. [name] and clinician agreed to meet every other week and then to decrease appointments as symptoms continue to improve.  Plan: Return again in  2 weeks.  Diagnosis: Axis I: Obsessive compulsive disorder   Shanterica Biehler A, LCSW 08/18/2014

## 2014-08-20 ENCOUNTER — Encounter (HOSPITAL_COMMUNITY): Payer: Self-pay | Admitting: Clinical

## 2014-08-25 ENCOUNTER — Encounter (HOSPITAL_COMMUNITY): Payer: Self-pay | Admitting: Clinical

## 2014-08-25 ENCOUNTER — Ambulatory Visit (INDEPENDENT_AMBULATORY_CARE_PROVIDER_SITE_OTHER): Payer: BLUE CROSS/BLUE SHIELD | Admitting: Clinical

## 2014-08-25 DIAGNOSIS — F42 Obsessive-compulsive disorder: Secondary | ICD-10-CM

## 2014-08-25 DIAGNOSIS — F429 Obsessive-compulsive disorder, unspecified: Secondary | ICD-10-CM

## 2014-08-25 NOTE — Progress Notes (Signed)
   THERAPIST PROGRESS NOTE  Session Time: 11:05 - 12:03  Participation Level: Active  Behavioral Response: Casual and NeatAlertNA  Type of Therapy: Individual Therapy  Treatment Goals addressed: improve psychiatric symptoms,  interpersonal relationship skills, emotional regulations skills  Interventions: CBT, Motivational Interviewing   Summary: Christopher Chase is a 34 y.o. male who presents with Obsessive compulsive disorder .   Suicidal/Homicidal: No - without intent/plan  Therapist Response:  Christopher Chase met with clinician for an individual session. Christopher Chase discussed his psychiatric symptoms, his current life events, and his homework. Christopher Chase shared his thoughts and his insights about his homework. Christopher Chase shared that he continues to remain single. He shared that he is working  A lot of overtime. Christopher Chase and clinician discussed one of his core beliefs (about his likability )  that causes him to have reassuring behaviors. Christopher Chase hared example of how this belief was confirmed. Client and clinician discussed the evidence he had. Client and clinician discussed his good points and his limitations. Christopher Chase is charming, kind , caring, and attractive, but he has very limited and very solitary interest. Christopher Chase had the insight that he might relate to a wider range of people if he participated in group activities. Client and clinician discussed the fact that it was okay to dabble that he did not have to be an expert in any activity. Client and clinician discussed how this might improve his self esteem (doing novel activities, meeting new people), but also expand his mind (seeing things through different experiences) plus would have the added benefit of having things to share in conversations with others. Christopher Chase stated he was going to look at his options. Christopher Chase agreed to continue his homework until next session  Plan: Return again in 2 weeks.  Diagnosis: Axis I: Obsessive compulsive  disorder   Estalene Bergey A, LCSW 08/25/2014

## 2014-09-15 ENCOUNTER — Ambulatory Visit (INDEPENDENT_AMBULATORY_CARE_PROVIDER_SITE_OTHER): Payer: BLUE CROSS/BLUE SHIELD | Admitting: Clinical

## 2014-09-15 ENCOUNTER — Ambulatory Visit (HOSPITAL_COMMUNITY): Payer: Self-pay | Admitting: Clinical

## 2014-09-15 DIAGNOSIS — F429 Obsessive-compulsive disorder, unspecified: Secondary | ICD-10-CM

## 2014-09-15 DIAGNOSIS — F42 Obsessive-compulsive disorder: Secondary | ICD-10-CM | POA: Diagnosis not present

## 2014-09-19 ENCOUNTER — Encounter (HOSPITAL_COMMUNITY): Payer: Self-pay | Admitting: Clinical

## 2014-09-19 NOTE — Psych (Signed)
   THERAPIST PROGRESS NOTE  Session Time: 11:32 -12:00  Participation Level: Active  Behavioral Response: CasualAlertNA  Type of Therapy: Individual Therapy  Treatment Goals addressed: improve psychiatric symptoms,  interpersonal relationship skills, emotional regulations skills  Interventions: CBT, Motivational Interviewing   Summary: Christopher Chase is a 34 y.o. male who presents with Obsessive compulsive disorder .   Suicidal/Homicidal: No - without intent/plan  Therapist Response:  [name] met with clinician for an individual session. Christopher Chase discussed his psychiatric symptoms, his current life events, and his homework. Christopher Chase shared that he was working a lot of over time. He shared that while his ex continued to try to engage with him, he has been minimally responsive which  Has helpped him to move forward as well as to see how his relationship patterns affect his OCD symptoms and visa versa.  Christopher Chase shared that he is planning to get a dog. Client and clinician discussed in previous sessions the need for Christopher Chase to devlope interest outside of work in order to improve his interpersonal relationship skills. Christopher Chase shared that having a dog would help him to take breaks, go exploring, join dog training classes. He shared that he also believes it will be beneficial for his kids. Client and clinician discussed some of the other steps he is working on to be engaged in social settings. Christopher Chase agreed to continue his homework until next session Plan: Return again in 3 weeks.  Diagnosis: Axis I: Obsessive compulsive disorder    Anaissa Macfadden A, LCSW 09/19/2014

## 2014-09-29 ENCOUNTER — Ambulatory Visit (INDEPENDENT_AMBULATORY_CARE_PROVIDER_SITE_OTHER): Payer: BLUE CROSS/BLUE SHIELD | Admitting: Clinical

## 2014-09-29 ENCOUNTER — Encounter (HOSPITAL_COMMUNITY): Payer: Self-pay | Admitting: Clinical

## 2014-09-29 DIAGNOSIS — F42 Obsessive-compulsive disorder: Secondary | ICD-10-CM | POA: Diagnosis not present

## 2014-09-29 DIAGNOSIS — F429 Obsessive-compulsive disorder, unspecified: Secondary | ICD-10-CM

## 2014-09-29 NOTE — Progress Notes (Signed)
   THERAPIST PROGRESS NOTE  Session Time:   Participation Level: Active  Behavioral Response: CasualAlertAnxious  Type of Therapy: Individual Therapy  Treatment Goals addressed: improve psychiatric symptoms, calming skills, interpersonal relationship skills, emotional regulations skills  Interventions: CBT, Motivational Interviewing and Other: Grounding and mindfulness techniques  Summary: Christopher Chase is a 34 y.o. male who presents with Obsessive compulsive disorder .  Suicidal/Homicidal: No - without intent/plan  Therapist Response:  Christopher Chase met with clinician for an individual session. Christopher Chase discussed his psychiatric symptoms, his current life events, and his homework. Christopher Chase and clinician discussed and reviewed Christopher Chase's homework. Christopher Chase shared an insight about his psychiatric symptoms. He shared about his childhood upbringing an an unconscious belief he had developed about having attention. Christopher Chase shared how he thought this belief affected his relationships and created anxiety and in turn obsessive thoughts within him. Christopher Chase shared about how recognizing the belief had brought up a lot of memories and emotions. Christopher Chase and clinician discussed his thoughts and emotions. Christopher Chase and clinician discussed the process of discovering and challenging belief and the emotions that can surface. Christopher Chase and clinician discussed healthier thought options. Christopher Chase shared that while it increases his anxiety to examine the belief it also brings relief to know he is able to change it and to be free from its restrictions. Christopher Chase agreed to continue his homework until next session.  Plan: Return again in 3 weeks.  Diagnosis: Axis I: Obsessive compulsive disorder    Christopher Chase A, LCSW 09/29/2014

## 2014-10-04 ENCOUNTER — Encounter (HOSPITAL_COMMUNITY): Payer: Self-pay | Admitting: Psychiatry

## 2014-10-04 ENCOUNTER — Ambulatory Visit (INDEPENDENT_AMBULATORY_CARE_PROVIDER_SITE_OTHER): Payer: BLUE CROSS/BLUE SHIELD | Admitting: Psychiatry

## 2014-10-04 VITALS — BP 124/71 | HR 58 | Ht 72.0 in | Wt 204.4 lb

## 2014-10-04 DIAGNOSIS — F429 Obsessive-compulsive disorder, unspecified: Secondary | ICD-10-CM

## 2014-10-04 DIAGNOSIS — F42 Obsessive-compulsive disorder: Secondary | ICD-10-CM

## 2014-10-04 MED ORDER — ESCITALOPRAM OXALATE 10 MG PO TABS: 10.0000 mg | ORAL_TABLET | Freq: Every day | ORAL | Status: DC

## 2014-10-04 NOTE — Progress Notes (Signed)
[facility] [id number] Progress Note  Christopher Chase [id number] 34 y.o.  10/04/2014 11:23 AM  Chief Complaint:  Medication management and follow-up.        History of Present Illness:  Christopher Chase came for his follow-up appointment.  He is taking his medication as prescribed.  He likes the Lexapro is helping his OCD symptoms.  Recently breakup with his girlfriend after 6 months of relationship.  In the beginning he was anxious and nervous and taken the Ativan but nice feeling fine.  He is also taking testosterone injection but sometime he feel the dose is low.  He has cut down his drinking significantly and he only remember drinking beer twice in past 3 months.  His sleep is good.  He has a puppy which is helping him a lot.  Patient denies any illegal substance use.  He denies any feeling of hopelessness or worthlessness.  He is seeing Thelma Barge for counseling.  Overall his anxiety depression and OCD symptoms are less intense and less frequent.  His appetite is okay.  His vitals are stable.  Suicidal Ideation: No Plan Formed: No Patient has means to carry out plan: No  Homicidal Ideation: No Plan Formed: No Patient has means to carry out plan: No  Past Psychiatric History/Hospitalization(s) Patient endorse history of anxiety and OCD symptoms while he was in college.  He took antidepressant which helped him but he does not remember the details.  He was given Xanax and Zoloft by his primary care physician which also helped him and he felt zombie. Patient also endorsed history of using drugs and alcohol in the college but denies any withdrawal symptoms.  Patient denies any history of mania, psychosis, hallucination, suicidal attempt or any inpatient psychiatric treatment. Anxiety: Yes Bipolar Disorder: No Depression: No Mania: No Psychosis: No Schizophrenia: No Personality Disorder: No Hospitalization for psychiatric illness: No History of Electroconvulsive Shock Therapy: No Prior Suicide  Attempts: No  Medical History; Patient denies any history of seizures.  His primary care physician is Dr. Lilyan Punt in [place].  He has seasonal allergies and low testosterone.  Review of Systems  Constitutional: Negative.   Cardiovascular: Negative for chest pain and palpitations.  Musculoskeletal: Negative.   Skin: Negative for itching and rash.  Neurological: Negative for dizziness.  Psychiatric/Behavioral: Negative for depression, suicidal ideas, hallucinations and substance abuse. The patient is nervous/anxious.     Psychiatric: Agitation: No Hallucination: No Depressed Mood: No Insomnia: No Hypersomnia: No Altered Concentration: No Feels Worthless: No Grandiose Ideas: No Belief In Special Powers: No New/Increased Substance Abuse: No Compulsions: No  Neurologic: Headache: No Seizure: No Paresthesias: No   Musculoskeletal: Strength & Muscle Tone: within normal limits Gait & Station: normal Patient leans: N/A   Outpatient Encounter Prescriptions as of 10/04/2014  Medication Sig  . cetirizine-pseudoephedrine (ZYRTEC-D) 5-120 MG per tablet Take 1 tablet by mouth 2 (two) times daily.  Marland Kitchen escitalopram (LEXAPRO) 10 MG tablet Take 1 tablet (10 mg total) by mouth daily.  . fluticasone (FLONASE) 50 MCG/ACT nasal spray Place 2 sprays into the nose daily.  Marland Kitchen LORazepam (ATIVAN) 0.5 MG tablet Take 1 tablet (0.5 mg total) by mouth as needed for anxiety.  . tadalafil (CIALIS) 10 MG tablet Take 1 tablet (10 mg total) by mouth daily as needed for erectile dysfunction.  Marland Kitchen testosterone cypionate (DEPOTESTOTERONE CYPIONATE) 200 MG/ML injection   . [DISCONTINUED] escitalopram (LEXAPRO) 10 MG tablet Take 1 tablet (10 mg total) by mouth daily.   No facility-administered encounter medications  on file as of 10/04/2014.    No results found for this or any previous visit (from the past 2160 hour(s)).    Constitutional:  BP 124/71 mmHg  Pulse 58  Ht 6' (1.829 m)  Wt 204 lb 6.4 oz  (92.715 kg)  BMI 27.72 kg/m2   Mental Status Examination;  Patient is well groomed well dressed man who is pleasant and cooperative.  His his speech is clear and coherent.  He described his mood euthymic and his affect is appropriate. His attention and concentration is fair.  He denies any auditory or visual hallucination.  He denies any active or passive suicidal thoughts or homicidal thought.  There were no delusions, paranoia or any obsessive thoughts.  His psychomotor activity is slightly increased.  His fund of knowledge is adequate.  He has no flight of ideas or any loose association.  He is alert and oriented 3.  His insight judgment and impulse control is okay.   Established Problem, Stable/Improving (1), Review of Last Therapy Session (1) and Review of Medication Regimen & Side Effects (2)  Assessment: Axis I: Obsessive-compulsive disorder.  Rule out alcohol abuse  Axis II: Deferred  Axis III:  Past Medical History  Diagnosis Date  . Environmental allergies   . Decreased testosterone level 2010    Reported then learned he has low testosterone levels  . Obsessive-compulsive disorder      Plan:  Patient is doing better on Lexapro 10 mg.  He has cut down his drinking and denies any binge or any intoxication.  He still have a lot of Ativan tablet remaining.  He does not need a new prescription.  Continue Lexapro 10 mg daily and therapy with Scarlette CalicoFrances for counseling.  Recommended to call us back if he has any question or any concern.  Follow-up in 3 months.  Decker Cogdell T., MD 10/04/2014

## 2014-10-13 ENCOUNTER — Ambulatory Visit (INDEPENDENT_AMBULATORY_CARE_PROVIDER_SITE_OTHER): Payer: BLUE CROSS/BLUE SHIELD | Admitting: Clinical

## 2014-10-13 ENCOUNTER — Encounter (HOSPITAL_COMMUNITY): Payer: Self-pay | Admitting: Clinical

## 2014-10-13 DIAGNOSIS — F429 Obsessive-compulsive disorder, unspecified: Secondary | ICD-10-CM

## 2014-10-13 DIAGNOSIS — F42 Obsessive-compulsive disorder: Secondary | ICD-10-CM | POA: Diagnosis not present

## 2014-10-13 NOTE — Progress Notes (Signed)
   THERAPIST PROGRESS NOTE  Session Time: 11:08 -12:00  Participation Level: Active  Behavioral Response: Casual and NeatAlertAnxious  Type of Therapy: Individual Therapy  Treatment Goals addressed: improve psychiatric symptoms, interpersonal relationship skills, emotional regulations skills  Interventions: CBT, Motivational Interviewing   Summary: Christopher Chase is a 34 y.o. male who presents with Obsessive compulsive disorder .   Suicidal/Homicidal: No - without intent/plan  Therapist Response:  Christopher Chase met with clinician for an individual session. Christopher Chase discussed his psychiatric symptoms, his current life events, and his homework. Christopher Chase shared that his symptoms are improving. Christopher Chase discussed some of his relationships and the changes he is noticing in his response to others. He shared the areas he feels he is improving in as well as the areas he recognizes that his OCD symptoms are causing him trouble. Client and clinician discussed the areas for improvement. Through this discussion Christopher Chase was able to formulate some healthier ways to view the situations and react. Christopher Chase had the insight that he needs to get out and participate in new activities if he wants to put into practice his skills.Christopher Chase and clinician discussed the emotional regulation skills he is using and explored ways he could improve. Christopher Chase continues to complete his homework. He agreed to continue his homework until next session. Christopher Chase shared he is glad to see progress.  Plan: Return again in 3 weeks.  Diagnosis: Axis I: Obsessive compulsive disorder    Rayvon Brandvold A, LCSW 10/13/2014

## 2014-10-27 ENCOUNTER — Ambulatory Visit (HOSPITAL_COMMUNITY): Payer: Self-pay | Admitting: Clinical

## 2014-11-08 ENCOUNTER — Ambulatory Visit (HOSPITAL_COMMUNITY): Payer: Self-pay | Admitting: Clinical

## 2014-12-05 ENCOUNTER — Ambulatory Visit (HOSPITAL_COMMUNITY): Payer: Self-pay | Admitting: Clinical

## 2015-01-09 ENCOUNTER — Ambulatory Visit (INDEPENDENT_AMBULATORY_CARE_PROVIDER_SITE_OTHER): Payer: BLUE CROSS/BLUE SHIELD | Admitting: Clinical

## 2015-01-09 ENCOUNTER — Encounter (HOSPITAL_COMMUNITY): Payer: Self-pay | Admitting: Clinical

## 2015-01-09 DIAGNOSIS — F42 Obsessive-compulsive disorder: Secondary | ICD-10-CM | POA: Diagnosis not present

## 2015-01-09 DIAGNOSIS — F429 Obsessive-compulsive disorder, unspecified: Secondary | ICD-10-CM

## 2015-01-09 NOTE — Progress Notes (Signed)
   THERAPIST PROGRESS NOTE  Session Time: 1:31 -2:30  Participation Level: Active  Behavioral Response: CasualAlertNA  Type of Therapy: Individual Therapy  Treatment Goals addressed: improve psychiatric symptoms, interpersonal relationship skills, emotional regulations skills  Interventions: CBT, Motivational Interviewing   Summary: AMARRION PASTORINO is a 34 y.o. male who presents with Obsessive compulsive disorder .   Suicidal/Homicidal: No - without intent/plan  Therapist Response:  [name] met with clinician for an individual session. Reginold discussed his psychiatric symptoms, his current life events, and his homework. Zackarie shared that it had been a while since he saw me last due to scheduling conflicts. He shared that he had been doing all right. He stated that he had accidentally stopped taking some of his mental health medication and then began feeling better and so has not returned to taking it. He shared that he did notice his OCD thoughts increasing. Client and clinician discussed his thoughts. Oland shared that he had been applying his CBT skills to discredit the thoughts ( most of the thoughts are about relationships). [name] and clinician discussed the fact that we behave based on our beliefs. Client and clinician discussed examples of such. Bert shared that in doing so his interactions with others has improved as well as his confidence. Hutch shared his insights about how his OCD thoughts affected who he chose to have in his life in the past. Client and clinician discussed some areas where his thoughts cause him a lot of stress. Client and clinician agreed to discuss these further at a future session.  Plan: Return again in 3 weeks.  Diagnosis: Axis I: Obsessive compulsive disorder    Ajai Terhaar A, LCSW 01/09/2015

## 2015-11-20 ENCOUNTER — Encounter: Payer: Self-pay | Admitting: Family Medicine

## 2015-11-20 ENCOUNTER — Telehealth: Payer: Self-pay | Admitting: Family Medicine

## 2015-11-20 DIAGNOSIS — Z3009 Encounter for other general counseling and advice on contraception: Secondary | ICD-10-CM

## 2015-11-20 NOTE — Telephone Encounter (Signed)
Spoke with patient and informed him per Dr.Scott Luking- we are  Referring to [facility] for vasectomy. Patient verbalized understanding.

## 2015-11-20 NOTE — Telephone Encounter (Signed)
Patient requesting referral for vasectomy.  Please advise.

## 2015-11-20 NOTE — Telephone Encounter (Signed)
Nurse's-please put in for this [facility]-let the patient know

## 2017-11-24 DIAGNOSIS — M25561 Pain in right knee: Secondary | ICD-10-CM | POA: Diagnosis not present

## 2017-11-24 DIAGNOSIS — S8391XA Sprain of unspecified site of right knee, initial encounter: Secondary | ICD-10-CM | POA: Diagnosis not present

## 2017-12-08 DIAGNOSIS — M25561 Pain in right knee: Secondary | ICD-10-CM | POA: Diagnosis not present

## 2018-03-09 DIAGNOSIS — Z79899 Other long term (current) drug therapy: Secondary | ICD-10-CM | POA: Diagnosis not present

## 2018-03-09 DIAGNOSIS — Z862 Personal history of diseases of the blood and blood-forming organs and certain disorders involving the immune mechanism: Secondary | ICD-10-CM | POA: Diagnosis not present

## 2018-03-09 DIAGNOSIS — Z125 Encounter for screening for malignant neoplasm of prostate: Secondary | ICD-10-CM | POA: Diagnosis not present

## 2018-03-09 DIAGNOSIS — E291 Testicular hypofunction: Secondary | ICD-10-CM | POA: Diagnosis not present

## 2018-03-16 DIAGNOSIS — M4184 Other forms of scoliosis, thoracic region: Secondary | ICD-10-CM | POA: Diagnosis not present

## 2018-03-16 DIAGNOSIS — J029 Acute pharyngitis, unspecified: Secondary | ICD-10-CM | POA: Diagnosis not present

## 2018-03-16 DIAGNOSIS — Z0000 Encounter for general adult medical examination without abnormal findings: Secondary | ICD-10-CM | POA: Diagnosis not present

## 2018-03-16 DIAGNOSIS — M549 Dorsalgia, unspecified: Secondary | ICD-10-CM | POA: Diagnosis not present

## 2018-03-16 DIAGNOSIS — B070 Plantar wart: Secondary | ICD-10-CM | POA: Diagnosis not present

## 2018-03-24 ENCOUNTER — Ambulatory Visit: Payer: BLUE CROSS/BLUE SHIELD | Admitting: Podiatry

## 2019-01-29 DIAGNOSIS — Z23 Encounter for immunization: Secondary | ICD-10-CM | POA: Diagnosis not present

## 2019-01-29 DIAGNOSIS — Z1331 Encounter for screening for depression: Secondary | ICD-10-CM | POA: Diagnosis not present

## 2019-01-29 DIAGNOSIS — M47896 Other spondylosis, lumbar region: Secondary | ICD-10-CM | POA: Diagnosis not present

## 2019-01-29 DIAGNOSIS — F419 Anxiety disorder, unspecified: Secondary | ICD-10-CM | POA: Diagnosis not present

## 2019-01-29 DIAGNOSIS — E291 Testicular hypofunction: Secondary | ICD-10-CM | POA: Diagnosis not present

## 2019-01-29 DIAGNOSIS — Z87898 Personal history of other specified conditions: Secondary | ICD-10-CM | POA: Diagnosis not present

## 2020-06-02 DIAGNOSIS — Z7189 Other specified counseling: Secondary | ICD-10-CM | POA: Diagnosis not present

## 2020-06-02 DIAGNOSIS — N529 Male erectile dysfunction, unspecified: Secondary | ICD-10-CM | POA: Diagnosis not present

## 2020-07-24 DIAGNOSIS — N529 Male erectile dysfunction, unspecified: Secondary | ICD-10-CM | POA: Diagnosis not present

## 2020-07-24 DIAGNOSIS — Z125 Encounter for screening for malignant neoplasm of prostate: Secondary | ICD-10-CM | POA: Diagnosis not present

## 2020-07-24 DIAGNOSIS — E291 Testicular hypofunction: Secondary | ICD-10-CM | POA: Diagnosis not present

## 2020-07-24 DIAGNOSIS — Z0000 Encounter for general adult medical examination without abnormal findings: Secondary | ICD-10-CM | POA: Diagnosis not present

## 2020-07-31 DIAGNOSIS — E291 Testicular hypofunction: Secondary | ICD-10-CM | POA: Diagnosis not present

## 2020-07-31 DIAGNOSIS — Z0000 Encounter for general adult medical examination without abnormal findings: Secondary | ICD-10-CM | POA: Diagnosis not present

## 2020-07-31 DIAGNOSIS — Z7251 High risk heterosexual behavior: Secondary | ICD-10-CM | POA: Diagnosis not present

## 2020-08-25 DIAGNOSIS — Z3009 Encounter for other general counseling and advice on contraception: Secondary | ICD-10-CM | POA: Diagnosis not present

## 2021-08-08 DIAGNOSIS — Z0000 Encounter for general adult medical examination without abnormal findings: Secondary | ICD-10-CM | POA: Diagnosis not present

## 2021-08-08 DIAGNOSIS — Z125 Encounter for screening for malignant neoplasm of prostate: Secondary | ICD-10-CM | POA: Diagnosis not present

## 2021-08-08 DIAGNOSIS — E291 Testicular hypofunction: Secondary | ICD-10-CM | POA: Diagnosis not present

## 2021-08-15 DIAGNOSIS — Z0000 Encounter for general adult medical examination without abnormal findings: Secondary | ICD-10-CM | POA: Diagnosis not present

## 2021-08-15 DIAGNOSIS — Z1339 Encounter for screening examination for other mental health and behavioral disorders: Secondary | ICD-10-CM | POA: Diagnosis not present

## 2021-08-15 DIAGNOSIS — F419 Anxiety disorder, unspecified: Secondary | ICD-10-CM | POA: Diagnosis not present

## 2021-08-15 DIAGNOSIS — Z1331 Encounter for screening for depression: Secondary | ICD-10-CM | POA: Diagnosis not present

## 2021-08-15 DIAGNOSIS — R82998 Other abnormal findings in urine: Secondary | ICD-10-CM | POA: Diagnosis not present

## 2022-09-04 DIAGNOSIS — N529 Male erectile dysfunction, unspecified: Secondary | ICD-10-CM | POA: Diagnosis not present

## 2022-09-04 DIAGNOSIS — F419 Anxiety disorder, unspecified: Secondary | ICD-10-CM | POA: Diagnosis not present

## 2022-09-04 DIAGNOSIS — Z125 Encounter for screening for malignant neoplasm of prostate: Secondary | ICD-10-CM | POA: Diagnosis not present

## 2022-09-04 DIAGNOSIS — E291 Testicular hypofunction: Secondary | ICD-10-CM | POA: Diagnosis not present

## 2022-09-11 DIAGNOSIS — Z1331 Encounter for screening for depression: Secondary | ICD-10-CM | POA: Diagnosis not present

## 2022-09-11 DIAGNOSIS — Z1339 Encounter for screening examination for other mental health and behavioral disorders: Secondary | ICD-10-CM | POA: Diagnosis not present

## 2022-09-11 DIAGNOSIS — F419 Anxiety disorder, unspecified: Secondary | ICD-10-CM | POA: Diagnosis not present

## 2022-09-11 DIAGNOSIS — F329 Major depressive disorder, single episode, unspecified: Secondary | ICD-10-CM | POA: Diagnosis not present

## 2022-09-11 DIAGNOSIS — N529 Male erectile dysfunction, unspecified: Secondary | ICD-10-CM | POA: Diagnosis not present

## 2022-09-11 DIAGNOSIS — E291 Testicular hypofunction: Secondary | ICD-10-CM | POA: Diagnosis not present

## 2022-09-11 DIAGNOSIS — Z0000 Encounter for general adult medical examination without abnormal findings: Secondary | ICD-10-CM | POA: Diagnosis not present

## 2022-09-18 DIAGNOSIS — M67912 Unspecified disorder of synovium and tendon, left shoulder: Secondary | ICD-10-CM | POA: Diagnosis not present

## 2022-10-04 DIAGNOSIS — F902 Attention-deficit hyperactivity disorder, combined type: Secondary | ICD-10-CM | POA: Diagnosis not present

## 2022-10-22 DIAGNOSIS — Z136 Encounter for screening for cardiovascular disorders: Secondary | ICD-10-CM | POA: Diagnosis not present

## 2022-10-22 DIAGNOSIS — F902 Attention-deficit hyperactivity disorder, combined type: Secondary | ICD-10-CM | POA: Diagnosis not present

## 2022-10-23 DIAGNOSIS — H019 Unspecified inflammation of eyelid: Secondary | ICD-10-CM | POA: Diagnosis not present

## 2022-10-25 DIAGNOSIS — F902 Attention-deficit hyperactivity disorder, combined type: Secondary | ICD-10-CM | POA: Diagnosis not present

## 2022-10-26 DIAGNOSIS — F902 Attention-deficit hyperactivity disorder, combined type: Secondary | ICD-10-CM | POA: Diagnosis not present

## 2022-10-30 DIAGNOSIS — H00022 Hordeolum internum right lower eyelid: Secondary | ICD-10-CM | POA: Diagnosis not present

## 2022-11-06 DIAGNOSIS — H00021 Hordeolum internum right upper eyelid: Secondary | ICD-10-CM | POA: Diagnosis not present

## 2022-11-06 DIAGNOSIS — F902 Attention-deficit hyperactivity disorder, combined type: Secondary | ICD-10-CM | POA: Diagnosis not present

## 2022-11-13 DIAGNOSIS — M67912 Unspecified disorder of synovium and tendon, left shoulder: Secondary | ICD-10-CM | POA: Diagnosis not present

## 2022-11-19 DIAGNOSIS — R531 Weakness: Secondary | ICD-10-CM | POA: Diagnosis not present

## 2022-11-19 DIAGNOSIS — M25511 Pain in right shoulder: Secondary | ICD-10-CM | POA: Diagnosis not present

## 2022-11-22 DIAGNOSIS — M25512 Pain in left shoulder: Secondary | ICD-10-CM | POA: Diagnosis not present

## 2022-12-03 DIAGNOSIS — M25511 Pain in right shoulder: Secondary | ICD-10-CM | POA: Diagnosis not present

## 2022-12-03 DIAGNOSIS — R531 Weakness: Secondary | ICD-10-CM | POA: Diagnosis not present

## 2022-12-04 DIAGNOSIS — M75112 Incomplete rotator cuff tear or rupture of left shoulder, not specified as traumatic: Secondary | ICD-10-CM | POA: Diagnosis not present

## 2022-12-10 DIAGNOSIS — R531 Weakness: Secondary | ICD-10-CM | POA: Diagnosis not present

## 2022-12-10 DIAGNOSIS — M25511 Pain in right shoulder: Secondary | ICD-10-CM | POA: Diagnosis not present

## 2022-12-17 DIAGNOSIS — M25511 Pain in right shoulder: Secondary | ICD-10-CM | POA: Diagnosis not present

## 2022-12-17 DIAGNOSIS — R531 Weakness: Secondary | ICD-10-CM | POA: Diagnosis not present

## 2023-01-07 DIAGNOSIS — M25511 Pain in right shoulder: Secondary | ICD-10-CM | POA: Diagnosis not present

## 2023-01-07 DIAGNOSIS — R531 Weakness: Secondary | ICD-10-CM | POA: Diagnosis not present

## 2023-01-28 DIAGNOSIS — H524 Presbyopia: Secondary | ICD-10-CM | POA: Diagnosis not present

## 2023-01-29 DIAGNOSIS — M67912 Unspecified disorder of synovium and tendon, left shoulder: Secondary | ICD-10-CM | POA: Diagnosis not present

## 2023-02-25 DIAGNOSIS — M25511 Pain in right shoulder: Secondary | ICD-10-CM | POA: Diagnosis not present

## 2023-02-25 DIAGNOSIS — R531 Weakness: Secondary | ICD-10-CM | POA: Diagnosis not present

## 2023-03-11 DIAGNOSIS — R531 Weakness: Secondary | ICD-10-CM | POA: Diagnosis not present

## 2023-03-11 DIAGNOSIS — M25511 Pain in right shoulder: Secondary | ICD-10-CM | POA: Diagnosis not present

## 2023-03-24 DIAGNOSIS — H532 Diplopia: Secondary | ICD-10-CM | POA: Diagnosis not present

## 2023-04-01 DIAGNOSIS — M25511 Pain in right shoulder: Secondary | ICD-10-CM | POA: Diagnosis not present

## 2023-04-01 DIAGNOSIS — R531 Weakness: Secondary | ICD-10-CM | POA: Diagnosis not present

## 2023-04-30 HISTORY — PX: COLONOSCOPY: SHX174

## 2023-11-14 ENCOUNTER — Encounter: Payer: Self-pay | Admitting: Gastroenterology

## 2024-01-15 ENCOUNTER — Encounter: Payer: Self-pay | Admitting: Gastroenterology

## 2024-01-15 ENCOUNTER — Other Ambulatory Visit (INDEPENDENT_AMBULATORY_CARE_PROVIDER_SITE_OTHER)

## 2024-01-15 ENCOUNTER — Ambulatory Visit: Payer: Self-pay | Admitting: Gastroenterology

## 2024-01-15 VITALS — BP 104/64 | HR 68 | Ht 72.0 in | Wt 203.5 lb

## 2024-01-15 DIAGNOSIS — R1011 Right upper quadrant pain: Secondary | ICD-10-CM

## 2024-01-15 DIAGNOSIS — Z1211 Encounter for screening for malignant neoplasm of colon: Secondary | ICD-10-CM

## 2024-01-15 DIAGNOSIS — R9389 Abnormal findings on diagnostic imaging of other specified body structures: Secondary | ICD-10-CM

## 2024-01-15 LAB — COMPREHENSIVE METABOLIC PANEL WITH GFR
ALT: 21 U/L (ref 0–53)
AST: 18 U/L (ref 0–37)
Albumin: 5 g/dL (ref 3.5–5.2)
Alkaline Phosphatase: 50 U/L (ref 39–117)
BUN: 21 mg/dL (ref 6–23)
CO2: 31 meq/L (ref 19–32)
Calcium: 9.8 mg/dL (ref 8.4–10.5)
Chloride: 103 meq/L (ref 96–112)
Creatinine, Ser: 1.11 mg/dL (ref 0.40–1.50)
GFR: 80.27 mL/min (ref >60.00)
Glucose, Bld: 77 mg/dL (ref 70–99)
Potassium: 4.2 meq/L (ref 3.5–5.1)
Sodium: 141 meq/L (ref 135–145)
Total Bilirubin: 0.6 mg/dL (ref 0.2–1.2)
Total Protein: 7.3 g/dL (ref 6.0–8.3)

## 2024-01-15 LAB — CBC WITH DIFFERENTIAL/PLATELET
Basophils Absolute: 0 K/uL (ref 0.0–0.1)
Basophils Relative: 0.8 % (ref 0.0–3.0)
Eosinophils Absolute: 0.1 K/uL (ref 0.0–0.7)
Eosinophils Relative: 1.7 % (ref 0.0–5.0)
HCT: 43.5 % (ref 39.0–52.0)
Hemoglobin: 14.5 g/dL (ref 13.0–17.0)
Lymphocytes Relative: 27 % (ref 12.0–46.0)
Lymphs Abs: 1.4 K/uL (ref 0.7–4.0)
MCHC: 33.2 g/dL (ref 30.0–36.0)
MCV: 87.3 fl (ref 78.0–100.0)
Monocytes Absolute: 0.5 K/uL (ref 0.1–1.0)
Monocytes Relative: 10 % (ref 3.0–12.0)
Neutro Abs: 3.1 K/uL (ref 1.4–7.7)
Neutrophils Relative %: 60.5 % (ref 43.0–77.0)
Platelets: 188 K/uL (ref 150.0–400.0)
RBC: 4.99 Mil/uL (ref 4.22–5.81)
RDW: 12.5 % (ref 11.5–15.5)
WBC: 5.1 K/uL (ref 4.0–10.5)

## 2024-01-15 LAB — LIPASE: Lipase: 19 U/L (ref 11.0–59.0)

## 2024-01-15 MED ORDER — NA SULFATE-K SULFATE-MG SULF 17.5-3.13-1.6 GM/177ML PO SOLN: 1.0000 | Freq: Once | ORAL | 0 refills | Status: AC

## 2024-01-15 NOTE — Progress Notes (Signed)
 Christopher Chase [id number] Dec 27, 1980   Chief Complaint: RUQ pain, discuss colonoscopy  Referring Provider: Valentin Skates, DO Primary GI MD: Sampson  HPI: Christopher Chase is a 43 y.o. male with past medical history of anxiety/depression, chronic back pain, OCD, tobacco abuse who presents today for a complaint of RUQ abdominal pain, and to schedule colonoscopy.    Referred by PCP for colonoscopy.  Father had colon polyps.  Labs 09/17/2023: Low HDL otherwise normal lipid panel, normal CBC, normal CMP, normal TSH, normal PSA   Patient denies any family history of colon cancer.  States that he has regular bowel movements and denies diarrhea, constipation, blood in his stool, or melena.  No prior colon cancer screening.  Over the last few months he has been noticing intermittent RUQ abdominal pain which tends to occur after eating large meals or eating fatty foods.  Pain lasts a few hours.  Denies any associated nausea, vomiting, fever, chills.  Has been occurring every time he eats badly, otherwise he feels okay.  Does still have his gallbladder.  Reports a couple aunts have history of gallbladder problems.  States he recently had a glass of wine while traveling and the next day noticed a gray stool.  Denies any jaundice or unintentional weight loss.  Denies any heart or lung problems.  No history of MI/stroke.  Denies any chest pain or shortness of breath.  Not on any blood thinners.  Denies previous surgeries.  Previous GI Procedures/Imaging      Past Medical History:  Diagnosis Date   Decreased testosterone  level 2010   Reported then learned he has low testosterone  levels   Environmental allergies    Obsessive-compulsive disorder     Past Surgical History:  Procedure Laterality Date   NO PAST SURGERIES      Current Outpatient Medications  Medication Sig Dispense Refill   Ascorbic Acid (VITAMIN C PO) Take 1 tablet by mouth daily.     L-TYROSINE PO Take 1 tablet by mouth  daily.     MAGNESIUM GLYCINATE PO Take 200 mg by mouth daily.     Vitamin D-Vitamin K (VITAMIN K2-VITAMIN D3 PO) Take 1 capsule by mouth daily.     No current facility-administered medications for this visit.    Allergies as of 01/15/2024 - Review Complete 01/15/2024  Allergen Reaction Noted   Penicillins  06/09/2011    Family History  Problem Relation Age of Onset   Hypertension Mother    COPD Mother    Anxiety disorder Mother    Depression Mother    Colon polyps Father    Diabetes Maternal Grandmother    Dementia Maternal Grandmother    Heart disease Maternal Grandfather        died age 64 ,aortic aneursym, heart attack   AAA (abdominal aortic aneurysm) Maternal Grandfather    Other Maternal Grandfather        meningitis   Diabetes Paternal Grandmother    Heart disease Paternal Grandfather    Diabetes Paternal Grandfather    Cancer Maternal Aunt        melanoma   Other Maternal Aunt        meningitis   Asthma Son     Social History   Tobacco Use   Smoking status: Former    Current packs/day: 0.00    Types: Cigarettes    Quit date: 2022    Years since quitting: 3.7   Smokeless tobacco: Never  Vaping Use   Vaping status: Never  Used  Substance Use Topics   Alcohol  use: Not Currently    Alcohol /week: 4.0 standard drinks of alcohol     Types: 2 Glasses of wine, 2 Cans of beer per week   Drug use: No     Review of Systems:    Constitutional: No weight loss, fever, chills Cardiovascular: No chest pain Respiratory: No SOB Gastrointestinal: See HPI and otherwise negative Hematologic: No bleeding    Physical Exam:  Vital signs: BP 104/64 (BP Location: Left Arm, Patient Position: Sitting, Cuff Size: Normal)   Pulse 68   Ht 6' (1.829 m) Comment: height measured without shoes  Wt 203 lb 8 oz (92.3 kg)   BMI 27.60 kg/m   Constitutional: Pleasant, well-appearing male in NAD, alert and cooperative Head:  Normocephalic and atraumatic.  Eyes: No scleral  icterus.  Respiratory: Respirations even and unlabored. Lungs clear to auscultation bilaterally.  No wheezes, crackles, or rhonchi.  Cardiovascular:  Regular rate and rhythm. No murmurs. No peripheral edema. Gastrointestinal:  Soft, nondistended, nontender. No rebound or guarding. Normal bowel sounds. No appreciable masses or hepatomegaly. Rectal:  Not performed.  Neurologic:  Alert and oriented x4;  grossly normal neurologically.  Skin:   Dry and intact without significant lesions or rashes. Psychiatric: Oriented to person, place and time. Demonstrates good judgement and reason without abnormal affect or behaviors.   RELEVANT LABS AND IMAGING: CBC    Component Value Date/Time   WBC 4.1 01/01/2013 0715   RBC 5.07 01/01/2013 0715   HGB 14.9 01/01/2013 0715   HCT 43.7 01/01/2013 0715   PLT 194 01/01/2013 0715   MCV 86.2 01/01/2013 0715   MCH 29.4 01/01/2013 0715   MCHC 34.1 01/01/2013 0715   RDW 12.7 01/01/2013 0715   LYMPHSABS 1.0 01/01/2013 0715   MONOABS 0.5 01/01/2013 0715   EOSABS 0.1 01/01/2013 0715   BASOSABS 0.0 01/01/2013 0715    CMP     Component Value Date/Time   NA 140 09/09/2012 0735   K 4.4 09/09/2012 0735   CL 105 09/09/2012 0735   CO2 28 09/09/2012 0735   GLUCOSE 93 02/21/2014 0001   BUN 19 09/09/2012 0735   CREATININE 1.12 09/09/2012 0735   CALCIUM 9.6 09/09/2012 0735   PROT 6.4 09/09/2012 0735   ALBUMIN 4.7 09/09/2012 0735   AST 23 09/09/2012 0735   ALT 30 09/09/2012 0735   ALKPHOS 56 09/09/2012 0735   BILITOT 0.7 09/09/2012 0735     Assessment/Plan:   Screening for colon cancer Patient due for initial screening colonoscopy.  Reports family history of colon polyps in his father.  No family history of colon cancer.  - Schedule colonoscopy. I thoroughly discussed the procedure with the patient to include nature of the procedure, alternatives, benefits, and risks (including but not limited to bleeding, infection, perforation,  anesthesia/cardiac/pulmonary complications). Patient verbalized understanding and gave verbal consent to proceed with procedure.   RUQ abdominal pain Patient reports he has been having a few months of intermittent RUQ abdominal pain which tends to occur after a large or fatty meal.,  If he eats a more healthy meal he has no issues.  Pain can last a few hours.  Not associated with any nausea, vomiting, fever, chills, jaundice.  Did have a gray stool on 1 occasion after having a glass of wine the night before, which he does not normally do.  Otherwise stools are brown.  - Order RUQ US  evaluate the gallbladder, liver, bile ducts - If nothing found on ultrasound consider  MRI - Labs: CBC, CMP, lipase   Camie Furbish, PA-C [facility] 01/15/2024, 3:34 PM  Patient Care Team: Valentin Skates, DO as PCP - General (Internal Medicine)

## 2024-01-15 NOTE — Patient Instructions (Addendum)
 Your provider has requested that you go to the basement level for lab work before leaving today. Press B on the elevator. The lab is located at the first door on the left as you exit the elevator.   You have been scheduled for an abdominal ultrasound at [facility] (1st floor of hospital) on 01/22/24 at 8 am. Please arrive 30 minutes prior to your appointment for registration. Make certain not to have anything to eat or drink after midnight prior to your appointment. Should you need to reschedule your appointment, please contact radiology at [phone]. This test typically takes about 30 minutes to perform.  You have been scheduled for a colonoscopy. Please follow written instructions given to you at your visit today.   If you use inhalers (even only as needed), please bring them with you on the day of your procedure.  DO NOT TAKE 7 DAYS PRIOR TO TEST- Trulicity (dulaglutide) Ozempic, Wegovy (semaglutide) Mounjaro (tirzepatide) Bydureon Bcise (exanatide extended release)  DO NOT TAKE 1 DAY PRIOR TO YOUR TEST Rybelsus (semaglutide) Adlyxin (lixisenatide) Victoza (liraglutide) Byetta (exanatide) ___________________________________________________________________________   The risk of serious complications following colonoscopy is low.  Risk of perforation during screening colonoscopy: 0.01 to 0.1 percent  The reported rates of postpolypectomy bleeding vary (typically 1 to 2 percent), with higher rates seen with the removal of larger polyps (ie, >2 cm). The risk of postpolypectomy bleeding may be increased in patients with thrombocytopenia or coagulopathies.   The estimated incidence of adverse cardiorespiratory events related to endoscopic procedures ranges from 0.27 to 0.9 percent depending on the patient population, specific procedure, and sedation type.  _______________________________________________________  If your blood pressure at your visit was 140/90 or greater,  please contact your primary care physician to follow up on this.  _______________________________________________________  If you are age 45 or older, your body mass index should be between 23-30. Your Body mass index is 27.6 kg/m. If this is out of the aforementioned range listed, please consider follow up with your Primary Care Provider.  If you are age 45 or younger, your body mass index should be between 19-25. Your Body mass index is 27.6 kg/m. If this is out of the aformentioned range listed, please consider follow up with your Primary Care Provider.   ________________________________________________________  The [facility] providers would like to encourage you to use MYCHART to communicate with providers for non-urgent requests or questions.  Due to long hold times on the telephone, sending your provider a message by [facility] may be a faster and more efficient way to get a response.  Please allow 48 business hours for a response.  Please remember that this is for non-urgent requests.  _______________________________________________________  [facility] is using a team-based approach to care.  Your team is made up of your doctor and two to three APPS. Our APPS (Nurse Practitioners and Physician Assistants) work with your physician to ensure care continuity for you. They are fully qualified to address your health concerns and develop a treatment plan. They communicate directly with your gastroenterologist to care for you. Seeing the Advanced Practice Practitioners on your physician's team can help you by facilitating care more promptly, often allowing for earlier appointments, access to diagnostic testing, procedures, and other specialty referrals.

## 2024-01-20 ENCOUNTER — Encounter: Payer: Self-pay | Admitting: Gastroenterology

## 2024-01-22 ENCOUNTER — Ambulatory Visit (HOSPITAL_COMMUNITY)
Admission: RE | Admit: 2024-01-22 | Discharge: 2024-01-22 | Disposition: A | Discharge status: Home / Self Care | Source: Ambulatory Visit | Attending: Gastroenterology | Admitting: Gastroenterology

## 2024-01-22 DIAGNOSIS — R1011 Right upper quadrant pain: Secondary | ICD-10-CM | POA: Insufficient documentation

## 2024-01-27 ENCOUNTER — Encounter: Payer: Self-pay | Admitting: Gastroenterology

## 2024-01-27 ENCOUNTER — Ambulatory Visit: Admitting: Gastroenterology

## 2024-01-27 VITALS — BP 120/78 | HR 51 | Temp 97.2°F | Resp 20 | Ht 72.0 in | Wt 203.8 lb

## 2024-01-27 DIAGNOSIS — K644 Residual hemorrhoidal skin tags: Secondary | ICD-10-CM

## 2024-01-27 DIAGNOSIS — Z1211 Encounter for screening for malignant neoplasm of colon: Secondary | ICD-10-CM | POA: Diagnosis present

## 2024-01-27 DIAGNOSIS — K5730 Diverticulosis of large intestine without perforation or abscess without bleeding: Secondary | ICD-10-CM | POA: Diagnosis not present

## 2024-01-27 DIAGNOSIS — K648 Other hemorrhoids: Secondary | ICD-10-CM | POA: Diagnosis not present

## 2024-01-27 MED ORDER — SODIUM CHLORIDE 0.9 % IV SOLN: 500.0000 mL | INTRAVENOUS | Status: DC

## 2024-01-27 NOTE — Progress Notes (Signed)
 Pt's states no medical or surgical changes since previsit or office visit.

## 2024-01-27 NOTE — Progress Notes (Signed)
 To pacu, VSS. Report to [email or web address]

## 2024-01-27 NOTE — Patient Instructions (Signed)
 Resume previous diet Continue present medications  Repeat colonoscopy in 10 years for surveillance  See handouts on diverticulosis and hemorrhoids  YOU HAD AN ENDOSCOPIC PROCEDURE TODAY AT THE [facility]:   Refer to the procedure report that was given to you for any specific questions about what was found during the examination.  If the procedure report does not answer your questions, please call your gastroenterologist to clarify.  If you requested that your care partner not be given the details of your procedure findings, then the procedure report has been included in a sealed envelope for you to review at your convenience later.  YOU SHOULD EXPECT: Some feelings of bloating in the abdomen. Passage of more gas than usual.  Walking can help get rid of the air that was put into your GI tract during the procedure and reduce the bloating. If you had a lower endoscopy (such as a colonoscopy or flexible sigmoidoscopy) you may notice spotting of blood in your stool or on the toilet paper. If you underwent a bowel prep for your procedure, you may not have a normal bowel movement for a few days.  Please Note:  You might notice some irritation and congestion in your nose or some drainage.  This is from the oxygen used during your procedure.  There is no need for concern and it should clear up in a day or so.  SYMPTOMS TO REPORT IMMEDIATELY: Following lower endoscopy (colonoscopy or flexible sigmoidoscopy):  Excessive amounts of blood in the stool  Significant tenderness or worsening of abdominal pains  Swelling of the abdomen that is new, acute  Fever of 100F or higher  For urgent or emergent issues, a gastroenterologist can be reached at any hour by calling [phone]. Do not use MyChart messaging for urgent concerns.   DIET:  We do recommend a small meal at first, but then you may proceed to your regular diet.  Drink plenty of fluids but you should avoid alcoholic beverages for 24  hours.  ACTIVITY:  You should plan to take it easy for the rest of today and you should NOT DRIVE or use heavy machinery until tomorrow (because of the sedation medicines used during the test).    FOLLOW UP: Our staff will call the number listed on your records the next business day following your procedure.  We will call around 7:15- 8:00 am to check on you and address any questions or concerns that you may have regarding the information given to you following your procedure. If we do not reach you, we will leave a message.     If any biopsies were taken you will be contacted by phone or by letter within the next 1-3 weeks.  Please call us  at [phone] if you have not heard about the biopsies in 3 weeks.   SIGNATURES/CONFIDENTIALITY: You and/or your care partner have signed paperwork which will be entered into your electronic medical record.  These signatures attest to the fact that that the information above on your After Visit Summary has been reviewed and is understood.  Full responsibility of the confidentiality of this discharge information lies with you and/or your care-partner.

## 2024-01-27 NOTE — Op Note (Addendum)
 [facility] Patient Name: Christopher Chase Procedure Date: 01/27/2024 1:33 PM MRN: [id number] Endoscopist: Gustav ALONSO Mcgee , MD, [id number] Age: 43 Referring MD:  Date of Birth: May 15, 1980 Gender: Male Account #: [id number] Procedure:                Colonoscopy Indications:              Screening for colorectal malignant neoplasm Medicines:                Monitored Anesthesia Care Procedure:                Pre-Anesthesia Assessment:                           - Prior to the procedure, a History and Physical                            was performed, and patient medications and                            allergies were reviewed. The patient's tolerance of                            previous anesthesia was also reviewed. The risks                            and benefits of the procedure and the sedation                            options and risks were discussed with the patient.                            All questions were answered, and informed consent                            was obtained. Prior Anticoagulants: The patient has                            taken no anticoagulant or antiplatelet agents. ASA                            Grade Assessment: II - A patient with mild systemic                            disease. After reviewing the risks and benefits,                            the patient was deemed in satisfactory condition to                            undergo the procedure.                           After obtaining informed consent, the colonoscope  was passed under direct vision. Throughout the                            procedure, the patient's blood pressure, pulse, and                            oxygen saturations were monitored continuously. The                            Olympus Scope SN: [id number] was introduced through                            the anus and advanced to the the cecum, identified                            by  appendiceal orifice and ileocecal valve. The                            colonoscopy was performed without difficulty. The                            patient tolerated the procedure well. The quality                            of the bowel preparation was good. The ileocecal                            valve, appendiceal orifice, and rectum were                            photographed. Scope In: 1:53:05 PM Scope Out: 2:05:42 PM Scope Withdrawal Time: 0 hours 10 minutes 43 seconds  Total Procedure Duration: 0 hours 12 minutes 37 seconds  Findings:                 The perianal and digital rectal examinations were                            normal.                           A few small-mouthed diverticula were found in the                            sigmoid colon.                           Non-bleeding external and internal hemorrhoids were                            found during retroflexion. The hemorrhoids were                            medium-sized. Complications:            No immediate complications. Estimated Blood Loss:  Estimated blood loss was minimal. Impression:               - Diverticulosis in the sigmoid colon.                           - Non-bleeding external and internal hemorrhoids.                           - No specimens collected. Recommendation:           - Resume previous diet.                           - Continue present medications.                           - Repeat colonoscopy in 10 years for surveillance. Christopher Haff V. [name], MD 01/27/2024 2:14:23 PM This report has been signed electronically.

## 2024-01-27 NOTE — Progress Notes (Signed)
 [facility] History and Physical   Primary Care Physician:  Valentin Skates, DO   Reason for Procedure:  Colorectal cancer screening  Plan:    Screening colonoscopy with possible interventions as needed     HPI: Christopher Chase is a very pleasant 43 y.o. male here for screening colonoscopy. Denies any nausea, vomiting, abdominal pain, melena or bright red blood per rectum  The risks and benefits as well as alternatives of endoscopic procedure(s) have been discussed and reviewed. All questions answered. The patient agrees to proceed.    Past Medical History:  Diagnosis Date   Decreased testosterone  level 2010   Reported then learned he has low testosterone  levels   Environmental allergies    Obsessive-compulsive disorder     Past Surgical History:  Procedure Laterality Date   NO PAST SURGERIES      Prior to Admission medications   Medication Sig Start Date End Date Taking? Authorizing Provider  Ascorbic Acid (VITAMIN C PO) Take 1 tablet by mouth daily.   Yes [provider]  cetirizine (ZYRTEC) 10 MG tablet Take 10 mg by mouth daily.   Yes [provider]  L-TYROSINE PO Take 1 tablet by mouth daily.   Yes [provider]  MAGNESIUM GLYCINATE PO Take 200 mg by mouth daily.   Yes [provider]  Vitamin D-Vitamin K (VITAMIN K2-VITAMIN D3 PO) Take 1 capsule by mouth daily.   Yes [provider]  zinc sulfate, 50mg  elemental zinc, 220 (50 Zn) MG capsule Take 220 mg by mouth daily.   Yes [provider]    Current Outpatient Medications  Medication Sig Dispense Refill   Ascorbic Acid (VITAMIN C PO) Take 1 tablet by mouth daily.     cetirizine (ZYRTEC) 10 MG tablet Take 10 mg by mouth daily.     L-TYROSINE PO Take 1 tablet by mouth daily.     MAGNESIUM GLYCINATE PO Take 200 mg by mouth daily.     Vitamin D-Vitamin K (VITAMIN K2-VITAMIN D3 PO) Take 1 capsule by mouth daily.     zinc sulfate, 50mg  elemental  zinc, 220 (50 Zn) MG capsule Take 220 mg by mouth daily.     Current Facility-Administered Medications  Medication Dose Route Frequency Provider Last Rate Last Admin   0.9 %  sodium chloride infusion  500 mL Intravenous Continuous Kensy Blizard V, MD        Allergies as of 01/27/2024 - Review Complete 01/27/2024  Allergen Reaction Noted   Penicillins  06/09/2011    Family History  Problem Relation Age of Onset   Hypertension Mother    COPD Mother    Anxiety disorder Mother    Depression Mother    Colon polyps Father    Diabetes Maternal Grandmother    Dementia Maternal Grandmother    Heart disease Maternal Grandfather        died age 45 ,aortic aneursym, heart attack   AAA (abdominal aortic aneurysm) Maternal Grandfather    Other Maternal Grandfather        meningitis   Diabetes Paternal Grandmother    Heart disease Paternal Grandfather    Diabetes Paternal Grandfather    Cancer Maternal Aunt        melanoma   Other Maternal Aunt        meningitis   Asthma Son     Social History   Socioeconomic History   Marital status: Single    Spouse name: Not on file   Number of  children: 2   Years of education: Not on file   Highest education level: Not on file  Occupational History   Occupation: [employer]  Tobacco Use   Smoking status: Former    Current packs/day: 0.00    Types: Cigarettes    Quit date: 2022    Years since quitting: 3.7   Smokeless tobacco: Never  Vaping Use   Vaping status: Never Used  Substance and Sexual Activity   Alcohol  use: Not Currently    Alcohol /week: 4.0 standard drinks of alcohol     Types: 2 Glasses of wine, 2 Cans of beer per week   Drug use: No   Sexual activity: Yes  Other Topics Concern   Not on file  Social History Narrative   Not on file   Social Drivers of Health   Financial Resource Strain: Not on file  Food Insecurity: Not on file  Transportation Needs: Not on file  Physical Activity: Not on file  Stress: Not  on file  Social Connections: Not on file  Intimate Partner Violence: Not on file    Review of Systems:  All other review of systems negative except as mentioned in the HPI.  Physical Exam: Vital signs in last 24 hours: BP 125/79   Pulse (!) 50   Temp (!) 97.2 F (36.2 C) (Temporal)   Ht 6' (1.829 m)   Wt 203 lb 12.8 oz (92.4 kg)   SpO2 100%   BMI 27.64 kg/m  General:   Alert, NAD Lungs:  Clear .   Heart:  Regular rate and rhythm Abdomen:  Soft, nontender and nondistended. Neuro/Psych:  Alert and cooperative. Normal mood and affect. A and O x 3  Reviewed labs, radiology imaging, old records and pertinent past GI work up  Patient is appropriate for planned procedure(s) and anesthesia in an ambulatory setting   K. Veena Khamiyah Grefe , MD [phone]

## 2024-01-28 ENCOUNTER — Telehealth: Payer: Self-pay

## 2024-01-28 NOTE — Telephone Encounter (Signed)
  Follow up Call-     01/27/2024   12:43 PM  Call back number  Post procedure Call Back phone  # [phone]  Permission to leave phone message Yes     Patient questions:  Do you have a fever, pain , or abdominal swelling? No. Pain Score  0 *  Have you tolerated food without any problems? Yes.    Have you been able to return to your normal activities? Yes.    Do you have any questions about your discharge instructions: Diet   No. Medications  No. Follow up visit  No.  Do you have questions or concerns about your Care? No.  Actions: * If pain score is 4 or above: No action needed, pain <4.

## 2024-02-04 ENCOUNTER — Encounter (HOSPITAL_BASED_OUTPATIENT_CLINIC_OR_DEPARTMENT_OTHER): Payer: Self-pay

## 2024-02-05 ENCOUNTER — Ambulatory Visit (HOSPITAL_BASED_OUTPATIENT_CLINIC_OR_DEPARTMENT_OTHER)
Admission: RE | Admit: 2024-02-05 | Discharge: 2024-02-05 | Disposition: A | Discharge status: Home / Self Care | Source: Ambulatory Visit | Attending: Gastroenterology | Admitting: Gastroenterology

## 2024-02-05 DIAGNOSIS — R1011 Right upper quadrant pain: Secondary | ICD-10-CM | POA: Insufficient documentation

## 2024-02-05 DIAGNOSIS — R9389 Abnormal findings on diagnostic imaging of other specified body structures: Secondary | ICD-10-CM | POA: Insufficient documentation

## 2024-02-05 MED ORDER — IOHEXOL 300 MG/ML  SOLN
100.0000 mL | Freq: Once | INTRAMUSCULAR | Status: AC | PRN
  Administered 2024-02-05: 100 mL via INTRAVENOUS

## 2024-02-09 ENCOUNTER — Ambulatory Visit: Payer: Self-pay | Admitting: Gastroenterology

## 2024-03-03 ENCOUNTER — Encounter: Payer: Self-pay | Admitting: Gastroenterology

## 2024-03-03 ENCOUNTER — Ambulatory Visit: Admitting: Gastroenterology

## 2024-03-03 VITALS — BP 100/62 | HR 82 | Ht 72.0 in | Wt 202.4 lb

## 2024-03-03 DIAGNOSIS — R1011 Right upper quadrant pain: Secondary | ICD-10-CM | POA: Diagnosis not present

## 2024-03-03 NOTE — Progress Notes (Signed)
 Christopher Chase [id number] [birth year]   Chief Complaint: Abdominal pain  Referring Provider: Valentin Skates, DO Primary GI MD: Dr. Shila  HPI: Christopher Chase is a 43 y.o. male with past medical history of anxiety/depression, chronic back pain, OCD, tobacco abuse who presents today for follow up.    Initially seen in office 01/15/2024, having been referred by PCP for colonoscopy.  Father had colon polyps.  He endorsed a few months of intermittent RUQ abdominal pain at that time occurring after a larger fatty meal.  Pain lasting a few hours, not associated with any nausea, vomiting, fever, chills, jaundice.  Did have a gray stool on one occasion after having glass of wine the night before, and otherwise stools were brown. Labs 09/17/2023: Low HDL otherwise normal lipid panel, normal CBC, normal CMP, normal TSH, normal PSA   He was scheduled for colonoscopy and RUQ ultrasound was ordered.  Labs at that time were normal.  Ultrasound showed a normal gallbladder and normal liver.  The pancreas was mildly echogenic and follow-up CT A/P was ordered.  CT showed no acute findings and pancreas appeared normal.  There was stool throughout the colon consistent with constipation and a small fat-containing umbilical hernia.  Colonoscopy 01/27/2024 revealed diverticulosis, internal and external hemorrhoids and otherwise normal with 10-year recall recommended.  Patient was advised to start a fiber supplement.   Discussed the use of AI scribe software for clinical note transcription with the patient, who gave verbal consent to proceed.  History of Present Illness Christopher Chase is a 43 year old male who presents with right-sided abdominal pain and dietary-related symptoms.  Right upper quadrant abdominal pain and dietary triggers - Dull, right-sided abdominal ache primarily after consuming fatty meals such as burgers - Pain is less noticeable with a healthy diet and regular exercise - No recent  heavy meals or alcohol  consumption, except for occasional fatty meals  Gastrointestinal symptoms and bowel habits - Single episode of gray stools after drinking wine, not recurrent - Bowel movements are regular - Previous CT scan showed significant stool burden in the colon - No acid reflux or heartburn - No dark urine  Dietary habits and supplement use - Takes Citrucel (fiber supplement) twice daily, which reduces bloating and fullness  Sleep disturbance and stress - Poor sleep attributed to dietary choices - High-carbohydrate and high-fat meals increase stress levels, as indicated by [employer] stress monitor - Stress and disrupted sleep correlate with dietary habits  Family history of gastrointestinal disease - Gallbladder issues in both maternal aunts - No family history of colon cancer or advanced polyps - Father has had a few colon polyps  Previous GI Procedures/Imaging   CT A/P 02/08/2024 1. No acute abnormality in the abdomen or pelvis. Unremarkable CT appearance of the pancreatic parenchyma. No pancreatic ductal dilation. 2. Colonic stool burden compatible with constipation. 3. Tiny fat containing umbilical hernia.  Colonoscopy 01/27/2024 - Diverticulosis in the sigmoid colon.  - Non- bleeding external and internal hemorrhoids.  - No specimens collected. - Recall 10 years  RUQ US  01/22/2024 1. No sonographic etiology for right upper quadrant pain identified. 2. Pancreas is mildly echogenic in appearance. This is nonspecific but can be seen in the setting of fatty infiltration or pancreatitis. Recommend correlation with lipase levels.    Past Medical History:  Diagnosis Date   Decreased testosterone  level 2010   Reported then learned he has low testosterone  levels   Environmental allergies    Obsessive-compulsive disorder  Past Surgical History:  Procedure Laterality Date   COLONOSCOPY  2025   NO PAST SURGERIES      Current Outpatient Medications   Medication Sig Dispense Refill   Ascorbic Acid (VITAMIN C PO) Take 1 tablet by mouth daily.     cetirizine (ZYRTEC) 10 MG tablet Take 10 mg by mouth daily.     L-TYROSINE PO Take 1 tablet by mouth daily.     MAGNESIUM GLYCINATE PO Take 200 mg by mouth daily.     Vitamin D-Vitamin K (VITAMIN K2-VITAMIN D3 PO) Take 1 capsule by mouth daily.     zinc sulfate, 50mg  elemental zinc, 220 (50 Zn) MG capsule Take 220 mg by mouth daily.     No current facility-administered medications for this visit.    Allergies as of 03/03/2024 - Review Complete 03/03/2024  Allergen Reaction Noted   Penicillins  06/09/2011    Family History  Problem Relation Age of Onset   Hypertension Mother    COPD Mother    Anxiety disorder Mother    Depression Mother    Colon polyps Father    Diabetes Maternal Grandmother    Dementia Maternal Grandmother    Heart disease Maternal Grandfather        died age 80 ,aortic aneursym, heart attack   AAA (abdominal aortic aneurysm) Maternal Grandfather    Other Maternal Grandfather        meningitis   Diabetes Paternal Grandmother    Heart disease Paternal Grandfather    Diabetes Paternal Grandfather    Cancer Maternal Aunt        melanoma   Other Maternal Aunt        meningitis   Asthma Son     Social History   Tobacco Use   Smoking status: Former    Current packs/day: 0.00    Types: Cigarettes    Quit date: 2022    Years since quitting: 3.8   Smokeless tobacco: Never  Vaping Use   Vaping status: Never Used  Substance Use Topics   Alcohol  use: Not Currently    Alcohol /week: 4.0 standard drinks of alcohol     Types: 2 Glasses of wine, 2 Cans of beer per week   Drug use: No     Review of Systems:    Constitutional: No weight loss, fever, chills Cardiovascular: No chest pain Respiratory: No SOB  Gastrointestinal: See HPI and otherwise negative    Physical Exam:  Vital signs: BP 100/62   Pulse 82   Ht 6' (1.829 m)   Wt 202 lb 6 oz (91.8 kg)    BMI 27.45 kg/m   Constitutional: Pleasant, well-appearing male in NAD, alert and cooperative Head:  Normocephalic and atraumatic.  Eyes: No scleral icterus.  Respiratory: Respirations even and unlabored. Lungs clear to auscultation bilaterally.  No wheezes, crackles, or rhonchi.  Cardiovascular:  Regular rate and rhythm. No murmurs. No peripheral edema. Gastrointestinal:  Soft, nondistended, nontender. No rebound or guarding. Normal bowel sounds. No appreciable masses or hepatomegaly. Rectal:  Not performed.  Neurologic:  Alert and oriented x4;  grossly normal neurologically.  Skin:   Dry and intact without significant lesions or rashes. Psychiatric: Oriented to person, place and time. Demonstrates good judgement and reason without abnormal affect or behaviors.   RELEVANT LABS AND IMAGING: CBC    Component Value Date/Time   WBC 5.1 01/15/2024 1622   RBC 4.99 01/15/2024 1622   HGB 14.5 01/15/2024 1622   HCT 43.5 01/15/2024 1622   PLT  188.0 01/15/2024 1622   MCV 87.3 01/15/2024 1622   MCH 29.4 01/01/2013 0715   MCHC 33.2 01/15/2024 1622   RDW 12.5 01/15/2024 1622   LYMPHSABS 1.4 01/15/2024 1622   MONOABS 0.5 01/15/2024 1622   EOSABS 0.1 01/15/2024 1622   BASOSABS 0.0 01/15/2024 1622    CMP     Component Value Date/Time   NA 141 01/15/2024 1622   K 4.2 01/15/2024 1622   CL 103 01/15/2024 1622   CO2 31 01/15/2024 1622   GLUCOSE 77 01/15/2024 1622   BUN 21 01/15/2024 1622   CREATININE 1.11 01/15/2024 1622   CREATININE 1.12 09/09/2012 0735   CALCIUM 9.8 01/15/2024 1622   PROT 7.3 01/15/2024 1622   ALBUMIN 5.0 01/15/2024 1622   AST 18 01/15/2024 1622   ALT 21 01/15/2024 1622   ALKPHOS 50 01/15/2024 1622   BILITOT 0.6 01/15/2024 1622     Assessment/Plan:   Assessment & Plan Right upper quadrant abdominal pain, possible gallbladder dysfunction Intermittent postprandial pain after fatty meals suggests gallbladder dysfunction. Imaging normal except stool burden. Family  history noted. Normal liver enzymes and lipase.  Does not have issues as long as he avoids unhealthy diet.  - Monitor for increased symptoms, jaundice, nausea, vomiting, or light-colored stools. - Continue dietary modifications to avoid fatty meals. - Consider HIDA scan if symptoms worsen or increase. - Discuss surgical options if dysfunction confirmed and symptoms persist.  Constipation Improved with fiber supplements, reducing bloating and fullness. Previous imaging showed stool burden.  - Continue fiber supplement. - Monitor for adverse effects such as gas or bloating.   Camie Furbish, PA-C [facility] 03/03/2024, 12:11 PM  Patient Care Team: Valentin Skates, DO as PCP - General (Internal Medicine)

## 2024-04-09 ENCOUNTER — Telehealth: Payer: Self-pay | Admitting: Gastroenterology

## 2024-04-09 DIAGNOSIS — R109 Unspecified abdominal pain: Secondary | ICD-10-CM

## 2024-04-09 NOTE — Telephone Encounter (Signed)
 Camie do you want to order HIDA for pt? You mentioned it  in the last office note

## 2024-04-09 NOTE — Telephone Encounter (Signed)
 Christopher Chase has been ordered and sent to the schedulers

## 2024-04-09 NOTE — Telephone Encounter (Signed)
 Inbound call from patient stating he would like for the nurse to give him a call back to help him possible schedule a hiada scan. Please advise.

## 2024-04-09 NOTE — Telephone Encounter (Signed)
 Left message on machine to call back

## 2024-04-12 NOTE — Telephone Encounter (Signed)
 Left message on machine to call back   Will also send message to the pt via My Chart

## 2024-05-10 ENCOUNTER — Ambulatory Visit (HOSPITAL_COMMUNITY)
Admission: RE | Admit: 2024-05-10 | Discharge: 2024-05-10 | Disposition: A | Discharge status: Home / Self Care | Source: Ambulatory Visit | Attending: Gastroenterology | Admitting: Gastroenterology

## 2024-05-10 DIAGNOSIS — R109 Unspecified abdominal pain: Secondary | ICD-10-CM | POA: Diagnosis present

## 2024-05-10 MED ORDER — TECHNETIUM TC 99M MEBROFENIN IV KIT
5.5000 | PACK | Freq: Once | INTRAVENOUS | Status: AC
  Administered 2024-05-10: 5.5 via INTRAVENOUS

## 2024-05-12 ENCOUNTER — Ambulatory Visit: Payer: Self-pay | Admitting: Gastroenterology
